# Patient Record
Sex: Female | Born: 1935
Health system: Southern US, Community
[De-identification: ages and names within clinical notes are randomized; demographics above are authoritative.]

## PROBLEM LIST (undated history)

## (undated) DIAGNOSIS — M4800 Spinal stenosis, site unspecified: Secondary | ICD-10-CM

## (undated) DIAGNOSIS — M1990 Unspecified osteoarthritis, unspecified site: Secondary | ICD-10-CM

## (undated) DIAGNOSIS — I10 Essential (primary) hypertension: Secondary | ICD-10-CM

## (undated) DIAGNOSIS — I83892 Varicose veins of left lower extremities with other complications: Secondary | ICD-10-CM

## (undated) HISTORY — PX: OTHER SURGICAL HISTORY: SHX169

## (undated) HISTORY — PX: CHOLECYSTECTOMY: SHX55

## (undated) HISTORY — DX: Essential (primary) hypertension: I10

## (undated) HISTORY — DX: Unspecified osteoarthritis, unspecified site: M19.90

## (undated) HISTORY — DX: Spinal stenosis, site unspecified: M48.00

## (undated) HISTORY — DX: Varicose veins of left lower extremity with other complications: I83.892

## (undated) HISTORY — PX: FRACTURE SURGERY: SHX138

---

## 2006-09-06 ENCOUNTER — Ambulatory Visit (HOSPITAL_COMMUNITY): Admission: RE | Admit: 2006-09-06 | Discharge: 2006-09-06 | Payer: Self-pay | Admitting: Internal Medicine

## 2006-09-06 ENCOUNTER — Ambulatory Visit: Payer: Self-pay | Admitting: Internal Medicine

## 2006-09-06 HISTORY — PX: COLONOSCOPY: SHX5424

## 2008-12-31 ENCOUNTER — Ambulatory Visit (HOSPITAL_COMMUNITY): Admission: RE | Admit: 2008-12-31 | Discharge: 2008-12-31 | Payer: Self-pay | Admitting: Family Medicine

## 2010-02-17 ENCOUNTER — Ambulatory Visit (HOSPITAL_COMMUNITY): Admission: RE | Admit: 2010-02-17 | Discharge: 2010-02-17 | Payer: Self-pay | Admitting: Family Medicine

## 2010-04-08 ENCOUNTER — Ambulatory Visit (HOSPITAL_COMMUNITY): Admission: RE | Admit: 2010-04-08 | Discharge: 2010-04-08 | Payer: Self-pay | Admitting: Ophthalmology

## 2010-04-29 ENCOUNTER — Ambulatory Visit (HOSPITAL_COMMUNITY): Admission: RE | Admit: 2010-04-29 | Discharge: 2010-04-29 | Payer: Self-pay | Admitting: Ophthalmology

## 2010-06-16 ENCOUNTER — Ambulatory Visit (HOSPITAL_COMMUNITY): Admission: RE | Admit: 2010-06-16 | Discharge: 2010-06-16 | Payer: Self-pay | Admitting: Family Medicine

## 2011-02-17 LAB — BASIC METABOLIC PANEL
Chloride: 99 mEq/L (ref 96–112)
GFR calc Af Amer: >60 mL/min (ref >60)
GFR calc non Af Amer: 54 mL/min — ABNORMAL LOW (ref >60)
Potassium: 4.2 mEq/L (ref 3.5–5.1)

## 2011-03-16 ENCOUNTER — Other Ambulatory Visit (HOSPITAL_COMMUNITY): Payer: Self-pay | Admitting: Family Medicine

## 2011-03-16 DIAGNOSIS — Z139 Encounter for screening, unspecified: Secondary | ICD-10-CM

## 2011-04-07 ENCOUNTER — Other Ambulatory Visit (HOSPITAL_COMMUNITY): Payer: Self-pay | Admitting: Family Medicine

## 2011-04-07 DIAGNOSIS — M545 Low back pain: Secondary | ICD-10-CM

## 2011-04-13 ENCOUNTER — Ambulatory Visit (HOSPITAL_COMMUNITY)
Admission: RE | Admit: 2011-04-13 | Discharge: 2011-04-13 | Disposition: A | Discharge status: Home / Self Care | Payer: Medicare HMO | Source: Ambulatory Visit | Attending: Family Medicine | Admitting: Family Medicine

## 2011-04-13 DIAGNOSIS — Z139 Encounter for screening, unspecified: Secondary | ICD-10-CM

## 2011-04-13 DIAGNOSIS — Z1231 Encounter for screening mammogram for malignant neoplasm of breast: Secondary | ICD-10-CM | POA: Insufficient documentation

## 2011-04-14 ENCOUNTER — Ambulatory Visit (HOSPITAL_COMMUNITY)
Admission: RE | Admit: 2011-04-14 | Discharge: 2011-04-14 | Disposition: A | Discharge status: Home / Self Care | Payer: Medicare HMO | Source: Ambulatory Visit | Attending: Family Medicine | Admitting: Family Medicine

## 2011-04-14 DIAGNOSIS — R937 Abnormal findings on diagnostic imaging of other parts of musculoskeletal system: Secondary | ICD-10-CM | POA: Insufficient documentation

## 2011-04-14 DIAGNOSIS — M545 Low back pain, unspecified: Secondary | ICD-10-CM | POA: Insufficient documentation

## 2011-04-14 DIAGNOSIS — M5380 Other specified dorsopathies, site unspecified: Secondary | ICD-10-CM | POA: Insufficient documentation

## 2011-04-17 NOTE — Op Note (Signed)
NAME:  Anna Mcconnell, Anna Mcconnell                ACCOUNT NO.:  [id number]   MEDICAL RECORD NO.:  [id number]          PATIENT TYPE:  AMB   LOCATION:  DAY                           FACILITY:  [facility]   PHYSICIAN:  R. Roetta Sessions, M.D. DATE OF BIRTH:  12-25-1930   DATE OF PROCEDURE:  09/06/2006  DATE OF DISCHARGE:                                 OPERATIVE REPORT   PROCEDURE:  Screening colonoscopy.   INDICATIONS FOR PROCEDURE:  The patient is a 77 year old lady with no lower  GI tract symptoms who has never had her lower GI tract imaged, referred over  by Dr. John Giovanni for colon cancer screening via colonoscopy.  There  is no family history colorectal neoplasia.  Colonoscopy is now being done as  a screening maneuver.  This approach has discussed with the patient at  length.  Potential risks, benefits and alternatives have been reviewed,  questions answered, and she is agreeable.  Please see the documentation in  the medical record.   PROCEDURE NOTE:  O2 saturation, blood pressure, pulse and respirations were  monitored throughout the entire procedure.  Conscious sedation with Versed 4  mg IV and Demerol 75 mg IV in divided doses.   INSTRUMENT:  Olympus video chip system.   FINDINGS:  Digital rectal exam revealed no abnormalities.   ENDOSCOPIC FINDINGS:  Prep was adequate.   Rectum:  Examination of the rectal mucosa including retroflexed view of the  anal verge revealed no abnormalities.   Colon:  Colonic mucosa was surveyed from the rectosigmoid junction through  the left, transverse and right colon into the area of the appendiceal  orifice, ileocecal valve and cecum.  These structures were well seen and  photographed for the record.  From this level, scope was slowly and  cautiously withdrawn.  All previously mentioned mucosal surfaces were again  seen.  The patient diffusely pigmented colonic mucosa consistent with  melanosis colon.  She had scattered left-sided diverticula.  The  the  remainder of the colonic mucosa appeared normal.  It was well seen.  Cecal  withdrawal time 8 minutes.  The patient tolerated the procedure well and was  reactive to endoscopy.   IMPRESSION:  Normal rectum.  Diffusely pigmented colon consistent with  melanosis colon.  Sigmoid diverticula.  The remainder of the colonic mucosa  appeared normal.   RECOMMENDATIONS:  1. Diverticulosis literature given to Ms. Maurine Minister.  2. Consider 1 more screening colonoscopy in 10 years.      Jonathon Bellows, M.D.  Electronically Signed     RMR/MEDQ  D:  09/06/2006  T:  09/07/2006  Job:  [id number]   cc:   Mila Homer. Sudie Bailey, M.D.  Fax: [phone]

## 2011-04-20 ENCOUNTER — Other Ambulatory Visit: Payer: Self-pay | Admitting: Family Medicine

## 2011-04-20 DIAGNOSIS — M545 Low back pain: Secondary | ICD-10-CM

## 2011-04-22 ENCOUNTER — Other Ambulatory Visit: Payer: Medicare HMO

## 2011-04-23 ENCOUNTER — Other Ambulatory Visit: Payer: Medicare HMO

## 2011-04-30 ENCOUNTER — Other Ambulatory Visit: Payer: Self-pay | Admitting: Family Medicine

## 2011-04-30 ENCOUNTER — Ambulatory Visit
Admission: RE | Admit: 2011-04-30 | Discharge: 2011-04-30 | Disposition: A | Discharge status: Home / Self Care | Payer: Medicare HMO | Source: Ambulatory Visit | Attending: Family Medicine | Admitting: Family Medicine

## 2011-04-30 DIAGNOSIS — M545 Low back pain, unspecified: Secondary | ICD-10-CM

## 2011-06-04 ENCOUNTER — Other Ambulatory Visit: Payer: Self-pay | Admitting: Family Medicine

## 2011-06-04 DIAGNOSIS — M545 Low back pain, unspecified: Secondary | ICD-10-CM

## 2011-06-17 ENCOUNTER — Ambulatory Visit
Admission: RE | Admit: 2011-06-17 | Discharge: 2011-06-17 | Disposition: A | Discharge status: Home / Self Care | Payer: Medicare HMO | Source: Ambulatory Visit | Attending: Family Medicine | Admitting: Family Medicine

## 2011-06-17 DIAGNOSIS — M545 Low back pain: Secondary | ICD-10-CM

## 2011-06-17 MED ORDER — METHYLPREDNISOLONE ACETATE 40 MG/ML INJ SUSP (RADIOLOG
120.0000 mg | Freq: Once | INTRAMUSCULAR | Status: AC
  Administered 2011-06-17: 120 mg via EPIDURAL

## 2011-06-17 MED ORDER — IOHEXOL 180 MG/ML  SOLN
1.0000 mL | Freq: Once | INTRAMUSCULAR | Status: AC | PRN
  Administered 2011-06-17: 1 mL via EPIDURAL

## 2011-07-30 ENCOUNTER — Other Ambulatory Visit: Payer: Self-pay | Admitting: Family Medicine

## 2011-07-30 DIAGNOSIS — M549 Dorsalgia, unspecified: Secondary | ICD-10-CM

## 2011-07-31 ENCOUNTER — Ambulatory Visit
Admission: RE | Admit: 2011-07-31 | Discharge: 2011-07-31 | Disposition: A | Discharge status: Home / Self Care | Payer: Medicare HMO | Source: Ambulatory Visit | Attending: Family Medicine | Admitting: Family Medicine

## 2011-07-31 DIAGNOSIS — M549 Dorsalgia, unspecified: Secondary | ICD-10-CM

## 2011-07-31 MED ORDER — METHYLPREDNISOLONE ACETATE 40 MG/ML INJ SUSP (RADIOLOG
120.0000 mg | Freq: Once | INTRAMUSCULAR | Status: AC
  Administered 2011-07-31: 120 mg via EPIDURAL

## 2011-07-31 MED ORDER — IOHEXOL 180 MG/ML  SOLN
1.0000 mL | Freq: Once | INTRAMUSCULAR | Status: AC | PRN
  Administered 2011-07-31: 1 mL via EPIDURAL

## 2011-12-01 HISTORY — PX: CATARACT EXTRACTION: SUR2

## 2012-05-03 ENCOUNTER — Other Ambulatory Visit (HOSPITAL_COMMUNITY): Payer: Self-pay | Admitting: Family Medicine

## 2012-05-03 DIAGNOSIS — M545 Low back pain: Secondary | ICD-10-CM

## 2012-05-06 ENCOUNTER — Ambulatory Visit (HOSPITAL_COMMUNITY)
Admission: RE | Admit: 2012-05-06 | Discharge: 2012-05-06 | Disposition: A | Discharge status: Home / Self Care | Payer: Medicare HMO | Source: Ambulatory Visit | Attending: Family Medicine | Admitting: Family Medicine

## 2012-05-06 DIAGNOSIS — M5137 Other intervertebral disc degeneration, lumbosacral region: Secondary | ICD-10-CM | POA: Insufficient documentation

## 2012-05-06 DIAGNOSIS — M545 Low back pain, unspecified: Secondary | ICD-10-CM | POA: Insufficient documentation

## 2012-05-06 DIAGNOSIS — M51379 Other intervertebral disc degeneration, lumbosacral region without mention of lumbar back pain or lower extremity pain: Secondary | ICD-10-CM | POA: Insufficient documentation

## 2014-04-05 ENCOUNTER — Ambulatory Visit (INDEPENDENT_AMBULATORY_CARE_PROVIDER_SITE_OTHER): Payer: Medicare Other | Admitting: Orthopedic Surgery

## 2014-04-05 ENCOUNTER — Ambulatory Visit (INDEPENDENT_AMBULATORY_CARE_PROVIDER_SITE_OTHER): Payer: Medicare Other

## 2014-04-05 VITALS — BP 108/66 | Ht 64.0 in | Wt 193.0 lb

## 2014-04-05 DIAGNOSIS — M25569 Pain in unspecified knee: Secondary | ICD-10-CM

## 2014-04-05 DIAGNOSIS — M25561 Pain in right knee: Secondary | ICD-10-CM

## 2014-04-05 DIAGNOSIS — M25562 Pain in left knee: Secondary | ICD-10-CM

## 2014-04-05 NOTE — Progress Notes (Signed)
Patient ID: Anna Mcconnell, female   DOB: 04/21/1931, 80 y.o.   MRN: [id number]  Chief Complaint  Patient presents with  . Knee Pain    Bilateral knee pain. Consult from DR. Gosrani    HISTORY: 80 year old female presents with bilateral knee pain history of chronic back pain already evaluated by Dr. Christy Sartorius he says no surgery can be done to help her he placed her in a pole. She comes in on this occasion for it out of 10 throbbing bilateral medial knee pain associated with catching and swelling. Review of systems notable for fatigue joint pain numbness unsteady gait otherwise systems are normal  She had a hysterectomy varicose vein surgery she has chronic edema she had gallbladder surgery. She has family history of lung disease and arthritis  She is retired she is widowed she does not smoke or drink  She takes atenolol hydrochlorothiazide naproxen tramadol lorazepam vitamin D and Voltaren gel.  Vital signs:   General the patient is well-developed and well-nourished grooming and hygiene are normal Oriented x3 Mood and affect normal Ambulation normal  Inspection of the bilateral knees and chin she has good range of motion but there is crepitance on range of motion in bilateral medial joint line tenderness motor exam is normal skin is clean dry and intact she has peripheral edema with normal sensation bilaterally  Imaging will be performed  Recommend bilateral injections  Return in 3 months  Continue tramadol and anti-inflammatories  Physical therapy for one month for strengthening gait control

## 2014-04-05 NOTE — Patient Instructions (Addendum)
Call [facility] to arrange thearpy 2 times a week for 4 weeks  Joint Injection  Care After  Refer to this sheet in the next few days. These instructions provide you with information on caring for yourself after you have had a joint injection. Your caregiver also may give you more specific instructions. Your treatment has been planned according to current medical practices, but problems sometimes occur. Call your caregiver if you have any problems or questions after your procedure.  After any type of joint injection, it is not uncommon to experience:  Soreness, swelling, or bruising around the injection site.  Mild numbness, tingling, or weakness around the injection site caused by the numbing medicine used before or with the injection. It also is possible to experience the following effects associated with the specific agent after injection:  Iodine-based contrast agents:  Allergic reaction (itching, hives, widespread redness, and swelling beyond the injection site).  Corticosteroids (These effects are rare.):  Allergic reaction.  Increased blood sugar levels (If you have diabetes and you notice that your blood sugar levels have increased, notify your caregiver).  Increased blood pressure levels.  Mood swings.  Hyaluronic acid in the use of viscosupplementation.  Temporary heat or redness.  Temporary rash and itching.  Increased fluid accumulation in the injected joint. These effects all should resolve within a day after your procedure.  HOME CARE INSTRUCTIONS  Limit yourself to light activity the day of your procedure. Avoid lifting heavy objects, bending, stooping, or twisting.  Take prescription or over-the-counter pain medication as directed by your caregiver.  You may apply ice to your injection site to reduce pain and swelling the day of your procedure. Ice may be applied 3-4 times:  Put ice in a plastic bag.  Place a towel between your skin and the bag.  Leave the ice on for no longer than  15-20 minutes each time. SEEK IMMEDIATE MEDICAL CARE IF:  Pain and swelling get worse rather than better or extend beyond the injection site.  Numbness does not go away.  Blood or fluid continues to leak from the injection site.  You have chest pain.  You have swelling of your face or tongue.  You have trouble breathing or you become dizzy.  You develop a fever, chills, or severe tenderness at the injection site that last longer than 1 day. MAKE SURE YOU:  Understand these instructions.  Watch your condition.  Get help right away if you are not doing well or if you get worse. Document Released: 07/30/2011 Document Revised: 02/08/2012 Document Reviewed: 07/30/2011  [facility] Patient Information 2014 [place].

## 2014-04-30 ENCOUNTER — Telehealth (HOSPITAL_COMMUNITY): Payer: Self-pay

## 2014-05-03 ENCOUNTER — Inpatient Hospital Stay (HOSPITAL_COMMUNITY): Admission: RE | Admit: 2014-05-03 | Payer: Medicare Other | Source: Ambulatory Visit | Admitting: Physical Therapy

## 2014-07-12 ENCOUNTER — Ambulatory Visit (INDEPENDENT_AMBULATORY_CARE_PROVIDER_SITE_OTHER): Payer: Medicare Other | Admitting: Orthopedic Surgery

## 2014-07-12 VITALS — BP 129/73 | Ht 64.0 in | Wt 193.0 lb

## 2014-07-12 DIAGNOSIS — M25561 Pain in right knee: Secondary | ICD-10-CM

## 2014-07-12 DIAGNOSIS — M25569 Pain in unspecified knee: Secondary | ICD-10-CM

## 2014-07-12 DIAGNOSIS — M25562 Pain in left knee: Secondary | ICD-10-CM

## 2014-07-12 NOTE — Patient Instructions (Addendum)
Go for one therapy session for home exercises  Call Dr Aline Brochure in 1 month

## 2014-07-12 NOTE — Progress Notes (Signed)
Follow up visit   BP 129/73  Ht 5\' 4"  (1.626 m)  Wt 193 lb (87.544 kg)  BMI 33.11 kg/m2   Osteoarthritis bilateral knees with right greater than left after 3 months of anti-inflammatories patient has not improved she was not able to undergo physical therapy secondary to cost  Review of systems she's also having some mild back pain nonradicular in nature  Vital signs are stable she is awake alert and oriented x3 mood and affect are normal she has valgus alignment to both knees the right knee is hurting worse than the left just lateral compartment pain with approximate 125 of knee flexion. Collateral ligaments and anterior and posterior cruciate ligaments are stable. Motor exam is normal her skin is intact with no neurovascular deficits  We decided to go ahead and injected the knee today. She will go to therapy for one visit return to the pool for additional therapy and then call in 1 month to see if her knee is any better and if not we will proceed with knee replacement surgery

## 2014-07-25 ENCOUNTER — Ambulatory Visit (HOSPITAL_COMMUNITY)
Admission: RE | Admit: 2014-07-25 | Discharge: 2014-07-25 | Disposition: A | Discharge status: Home / Self Care | Payer: Medicare Other | Source: Ambulatory Visit | Attending: Orthopedic Surgery | Admitting: Orthopedic Surgery

## 2014-07-25 DIAGNOSIS — M25669 Stiffness of unspecified knee, not elsewhere classified: Secondary | ICD-10-CM | POA: Diagnosis present

## 2014-07-25 DIAGNOSIS — IMO0001 Reserved for inherently not codable concepts without codable children: Secondary | ICD-10-CM | POA: Insufficient documentation

## 2014-07-25 DIAGNOSIS — M25659 Stiffness of unspecified hip, not elsewhere classified: Secondary | ICD-10-CM

## 2014-07-25 DIAGNOSIS — M6281 Muscle weakness (generalized): Secondary | ICD-10-CM

## 2014-07-25 NOTE — Evaluation (Signed)
Physical Therapy Evaluation  Patient Details  Name: Anna Mcconnell MRN: [id number] Date of Birth: 07-14-1931  Today's Date: 07/25/2014 Time: 1350-1430 PT Time Calculation (min): 40 min     Charges: 1 evaluation, TherEx 1405-1430         Visit#: 1 of 10  Re-eval: 08/24/14 Assessment Diagnosis: Bilateral knee pain Next MD Visit: Aline Brochure Prior Therapy: no  Authorization: [employer] - medicare     Past Medical History: No past medical history on file. Past Surgical History: No past surgical history on file.  Subjective Symptoms/Limitations Symptoms: Knee pain. Patient's goals is to come in for this one treatment and to not come back, she would like exercises. Patient would like to come once a month to progress exercises as needed.  Pertinent History: Patent has bilateral knee pain that alternates from one knee to the next. no history of orthoeadic surgeries How long can you walk comfortably?: <68minutes Patient Stated Goals: patient wants to be able to walk and go up and down stairs with less pain.  Pain Assessment Currently in Pain?: Yes Pain Location: Knee Pain Orientation: Right;Left;Anterior Pain Type: Chronic pain Effect of Pain on Daily Activities: una ble to do steps with a reciprocal gait pattern and needs UE support,   Cognition/Observation Observation/Other Assessments Observations: Gait: limited hip internal rotation, excessive toe out, limited pelvic rotation,  excessive knee valgus moment, scissoring of knees and foot flat, with excessive pronation and limited calcaneal invrsion, excessive eversion.   Sensation/Coordination/Flexibility/Functional Tests Flexibility Obers: Negative 90/90: Negative Functional Tests Functional Tests: ely test positive/  Assessment RLE AROM (degrees) RLE Overall AROM Comments: Rt 54 degrees ER, Lt 21 IR Right Knee Extension: 0 Right Knee Flexion: 125 Right Ankle Dorsiflexion: 10 RLE Strength Right Hip Extension: 2+/5 Right Hip  ABduction: 2+/5 LLE AROM (degrees) Left Hip External Rotation : 46 Left Hip Internal Rotation : 24 Left Knee Extension: 0 Left Knee Flexion: 125 Left Ankle Dorsiflexion: 5 LLE Strength Left Hip ABduction: 2+/5 Left Knee Extension: 2+/5  Exercise/Treatments Stretches Quadraceps 4x 20seconds Piriformis 10x 3second hold Hamstring 10x 3seconds hold quadraceps 10x 3 second hold Groin 10x 3 second hold Calf 10x 3 second hold 3D hip excursion 10x Bridges 190x second hold  Physical Therapy Assessment and Plan PT Assessment and Plan Clinical Impression Statement: Patient displays bilateral knee pain attributed to limited hip mobility and limited ankle mobility as well as decreased bilateral glute strenth with Lt hip weaker than Rt hipp resulting in a trendelenburg gait pattern, excessive knee valgus moment, and scissoring on knees during gait. patient will benefit from skilled phsyical therapy to address the above listed limitations to returnt op walking pain free and ambulatign up and down stairs withotu difficulty.  Pt will benefit from skilled therapeutic intervention in order to improve on the following deficits: Abnormal gait;Decreased activity tolerance;Decreased balance;Difficulty walking;Pain;Impaired flexibility;Improper body mechanics;Decreased range of motion;Increased fascial restricitons;Decreased mobility;Decreased strength Rehab Potential: Good Clinical Impairments Affecting Rehab Potential: highly motivated, limited ability to attend therapy due to financial restraints.  PT Frequency: Min 1X/week PT Duration: 12 weeks PT Plan: initial focus on stretches to improve gait. Next session to focus on introduciton of strengtheing exercises to improve gait mechanics.     Goals Home Exercise Program Pt/caregiver will Perform Home Exercise Program: For increased ROM PT Goal: Perform Home Exercise Program - Progress: Goal set today PT Short Term Goals Time to Complete Short Term  Goals: 4 weeks PT Short Term Goal 1: Patient will increase hip internal rotation >35 degrees  bilaterally to improve shock absorbtion during gait PT Short Term Goal 2: Patient will increase hip extension to >10 degrees bilaterally to improve stride length PT Short Term Goal 3: Patient will increase ankle dorsiflexion to > 15 degrees bilaterally to decrease early heel rise.  PT Short Term Goal 4: Patient will demonstrate a negative ely's test indicateing improving quadraceps mobilty  and improved ability to squat to chair PT Long Term Goals Time to Complete Long Term Goals: 12 weeks PT Long Term Goal 1: Patient will demosntrate increased hip abduction strength to 3+/5MMT to increase hip and knee stability during gait PT Long Term Goal 2: Patient will demosntrate increased hip abduction strength to 4/5MMT to increase hip and knee stability during gait so patient will ambulate withotu trendelenberg gait Long Term Goal 3: Patient will dmeosntrate hip extension strength of 4/5 MMT to improve ability to squat to chair Long Term Goal 4: Patient will dmeosntrate increased quad/hamstring strength to 5/5 MMT so patient caqn ambulate up and down stairs.   Problem List Patient Active Problem List   Diagnosis Date Noted  . Stiffness of joint, not elsewhere classified, lower leg 07/25/2014  . Stiffness of joint, not elsewhere classified, pelvic region and thigh 07/25/2014  . Muscle weakness (generalized) 07/25/2014    PT - End of Session Activity Tolerance: Patient tolerated treatment well General Behavior During Therapy: WFL for tasks assessed/performed PT Plan of Care PT Home Exercise Plan: quad, hamstring, calf, groin, hip flexor stretches and bridges PT Patient Instructions: hand outs given.  Consulted and Agree with Plan of Care: Patient  GP Functional Assessment Tool Used: FOTO to be completed next session, Clinical judgement based on strength and ROM measurements Functional Limitation:  Mobility: Walking and moving around Mobility: Walking and Moving Around Current Status (B0175): At least 40 percent but less than 60 percent impaired, limited or restricted Mobility: Walking and Moving Around Goal Status [phone]): At least 20 percent but less than 40 percent impaired, limited or restricted  Delanda Bulluck R 07/25/2014, 3:00 PM  Physician Documentation Your signature is required to indicate approval of the treatment plan as stated above.  Please sign and either send electronically or make a copy of this report for your files and return this physician signed original.   Please mark one 1.__approve of plan  2. ___approve of plan with the following conditions.   ______________________________                                                          _____________________ Physician Signature                                                                                                             Date

## 2014-08-10 ENCOUNTER — Inpatient Hospital Stay (HOSPITAL_COMMUNITY): Admission: RE | Admit: 2014-08-10 | Payer: Medicare Other | Source: Ambulatory Visit | Admitting: Physical Therapy

## 2014-08-15 ENCOUNTER — Encounter: Payer: Self-pay | Admitting: Internal Medicine

## 2014-08-16 ENCOUNTER — Ambulatory Visit (HOSPITAL_COMMUNITY): Payer: Medicare Other | Admitting: Physical Therapy

## 2014-08-20 ENCOUNTER — Encounter: Payer: Self-pay | Admitting: Gastroenterology

## 2014-08-20 ENCOUNTER — Ambulatory Visit (INDEPENDENT_AMBULATORY_CARE_PROVIDER_SITE_OTHER): Payer: Medicare Other | Admitting: Gastroenterology

## 2014-08-20 VITALS — BP 133/74 | HR 57 | Temp 97.0°F | Ht 63.0 in | Wt 190.6 lb

## 2014-08-20 DIAGNOSIS — K5900 Constipation, unspecified: Secondary | ICD-10-CM | POA: Insufficient documentation

## 2014-08-20 DIAGNOSIS — K5641 Fecal impaction: Secondary | ICD-10-CM | POA: Insufficient documentation

## 2014-08-20 MED ORDER — LINACLOTIDE 290 MCG PO CAPS: 290.0000 ug | ORAL_CAPSULE | Freq: Every day | ORAL | Status: DC

## 2014-08-20 NOTE — Progress Notes (Signed)
Primary Care Physician:  Robert Bellow, MD  Primary Gastroenterologist:  Garfield Cornea, MD   Chief Complaint  Patient presents with  . Constipation    HPI:  Anna Mcconnell is a 80 y.o. female here for further evaluation of constipation. She was last seen at time of colonoscopy in October 2007. This was her one and only colonoscopy. She had melanosis:, Sigmoid diverticula but otherwise unremarkable. Complains of one-year history of worsening constipation. Chronically has had a tendency towards constipation. Has been using various over the counter laxatives recently without significant improvement. Tried fleets enema, Dulcolax suppositories, stool softeners post laxatives. MiraLax in the past. Has been having issues with her hemorrhoids swelling. No bleeding. Recently had to digitally disimpact herself. Felt hard small stool in her rectum, feels like she's never been able to pass it. Worried about having a colonoscopy because she feels like she has nobody to take her. No family here locally. Trying [facility] for hemorrhoid swelling.   Current Outpatient Prescriptions  Medication Sig Dispense Refill  . atenolol (TENORMIN) 50 MG tablet 50 mg daily.       . hydrochlorothiazide (HYDRODIURIL) 25 MG tablet 25 mg daily.       Marland Kitchen LORazepam (ATIVAN) 1 MG tablet 1 mg. Only as needed     Not even every daily      . naproxen (NAPROSYN) 500 MG tablet 500 mg 2 (two) times daily with a meal. Takes once daily      . traMADol (ULTRAM) 50 MG tablet 50 mg daily.       . Vitamin D, Ergocalciferol, (DRISDOL) 50000 UNITS CAPS capsule Take 50,000 Units by mouth every 7 (seven) days.      . Linaclotide (LINZESS) 290 MCG CAPS capsule Take 1 capsule (290 mcg total) by mouth daily. On empty stomach  30 capsule  5   No current facility-administered medications for this visit.    Allergies as of 08/20/2014  . (No Known Allergies)    Past Medical History  Diagnosis Date  . HTN (hypertension)   . Spinal stenosis   .  Arthritis     Past Surgical History  Procedure Laterality Date  . Colonoscopy  09/06/2006    FTD:DUKGUR rectum.  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  Sigmoid diverticula.  The remainder of the colonic mucosa normal  . Hysterectomy    . Varicose vein ligation    . Cholecystectomy      Family History  Problem Relation Age of Onset  . Colon cancer Neg Hx   . Tuberculosis Mother     deceased at young age  . Tuberculosis Father     deceased at young age    History   Social History  . Marital Status: Widowed    Spouse Name: N/A    Number of Children: N/A  . Years of Education: N/A   Occupational History  . Not on file.   Social History Main Topics  . Smoking status: Former Research scientist (life sciences)  . Smokeless tobacco: Not on file     Comment: Quit smoking x 25 years  . Alcohol Use: Not on file  . Drug Use: Not on file  . Sexual Activity: Not on file   Other Topics Concern  . Not on file   Social History Narrative  . No narrative on file      ROS:  General: Negative for anorexia, weight loss, fever, chills, fatigue, weakness. Eyes: Negative for vision changes.  ENT: Negative for hoarseness, difficulty swallowing ,  nasal congestion. CV: Negative for chest pain, angina, palpitations, dyspnea on exertion, peripheral edema.  Respiratory: Negative for dyspnea at rest, dyspnea on exertion, cough, sputum, wheezing.  GI: See history of present illness. GU:  Negative for dysuria, hematuria, urinary incontinence, urinary frequency, nocturnal urination.  MS: Chronic knee and back pain  Derm: Negative for rash or itching.  Neuro: Negative for weakness, abnormal sensation, seizure, frequent headaches, memory loss, confusion.  Psych: Negative for anxiety, depression, suicidal ideation, hallucinations.  Endo: Negative for unusual weight change.  Heme: Negative for bruising or bleeding. Allergy: Negative for rash or hives.    Physical Examination:  BP 133/74  Pulse  57  Temp(Src) 97 F (36.1 C) (Oral)  Ht 5\' 3"  (1.6 m)  Wt 190 lb 9.6 oz (86.456 kg)  BMI 33.77 kg/m2   General: Well-nourished, well-developed in no acute distress.  Head: Normocephalic, atraumatic.   Eyes: Conjunctiva pink, no icterus. Mouth: Oropharyngeal mucosa moist and pink , no lesions erythema or exudate. Neck: Supple without thyromegaly, masses, or lymphadenopathy.  Lungs: Clear to auscultation bilaterally.  Heart: Regular rate and rhythm, no murmurs rubs or gallops.  Abdomen: Bowel sounds are normal, nontender, nondistended, no hepatosplenomegaly or masses, no abdominal bruits or    hernia , no rebound or guarding.   Rectal: Large hemorrhoid noted externally without swelling, erythema. Good anorectal sphincter tone. Stool ball noted proximally, patient was able to bring down with bearing down but did not tolerate attempts for disimpaction. Felt this was less likely to be a rectal mass. No blood noted on the exam glove. Brown stool heme negative. Extremities: No lower extremity edema. No clubbing or deformities.  Neuro: Alert and oriented x 4 , grossly normal neurologically.  Skin: Warm and dry, no rash or jaundice.   Psych: Alert and cooperative, normal mood and affect.  Labs: Labs from April 2015 total bilirubin 0.7, alkaline phosphatase 40, AST 15, ALT 11, albumin 4.2, creatinine 0.99, BUN 20  Imaging Studies: No results found.

## 2014-08-20 NOTE — Assessment & Plan Note (Signed)
80 year old lady with chronic constipation, small fecal impaction noted on exam. Suspect constipation worsened by tramadol use. Last colonoscopy 8 years ago. Heme negative today on exam. Patient not opposed colonoscopy but worried about transportation concerns. No family here locally. We will implement chronic constipation therapy including Linzess 232mcg daily. She will continue 2 yogurts daily for probiotic effects. Today we will give her Miralax purge. Fleet enema distally. She will come back in 2 weeks for repeat rectal exam to verify for rectal mass and stool passed. If she feels like she is not getting relief in the next day or two, she will let me know.

## 2014-08-20 NOTE — Patient Instructions (Signed)
1. Today, go home and take Linzess one on empty stomach. (You will continue this once daily for chronic constipation. A voucher for 30 day supply has been given, take it to your pharmacy.)  2. Then start Miralax for bowel cleansing. Take one packet mixed in 4 ounces of water every hour for 5 to 6 hours. After each dose of Miralax, drink 8 ounces of fluid of choice.  3. Take one Fleet enema, after your 3rd dose of Miralax.  4. Call if you have any problems.  5. Return to office in 2 weeks for repeat rectal exam. We will discuss if you need a colonoscopy at that time.

## 2014-08-21 NOTE — Progress Notes (Signed)
cc'ed to pcp °

## 2014-09-05 ENCOUNTER — Ambulatory Visit: Payer: Medicare Other | Admitting: Gastroenterology

## 2014-09-06 ENCOUNTER — Encounter: Payer: Self-pay | Admitting: Gastroenterology

## 2014-09-06 ENCOUNTER — Ambulatory Visit (INDEPENDENT_AMBULATORY_CARE_PROVIDER_SITE_OTHER): Payer: Medicare Other | Admitting: Gastroenterology

## 2014-09-06 VITALS — BP 111/66 | HR 60 | Temp 98.2°F | Ht 64.0 in | Wt 193.0 lb

## 2014-09-06 DIAGNOSIS — K6289 Other specified diseases of anus and rectum: Secondary | ICD-10-CM

## 2014-09-06 DIAGNOSIS — K5909 Other constipation: Secondary | ICD-10-CM

## 2014-09-06 DIAGNOSIS — R198 Other specified symptoms and signs involving the digestive system and abdomen: Secondary | ICD-10-CM

## 2014-09-06 NOTE — Progress Notes (Signed)
Primary Care Physician: Robert Bellow, MD  Primary Gastroenterologist:  Garfield Cornea, MD   Chief Complaint  Patient presents with  . Follow-up    HPI: Anna Mcconnell is a 80 y.o. female here for followup of constipation. She was last seen on September 21. On rectal exam she had large hemorrhoid noted externally. On digital exam she was noted to have what felt to be a stool ball but was not within reach to determine, seems to be mobile but rectal mass could not be excluded. She was heme-negative. Started on Linzess after a Miralax purge/Fleet enema. She presents back today for repeat rectal exam. She is due for routine colonoscopy 2017, did not want to pursue right now due to transportation concerns unless necessary.  Linzess every day. Has a BM about every day but takes few attempts to get it out. Feels pressure in rectum. Some gas. Feels like never gets rectum empty. No abdominal pain, vomiting, anorexia, heartburn.  Current Outpatient Prescriptions  Medication Sig Dispense Refill  . atenolol (TENORMIN) 50 MG tablet 50 mg daily.       . hydrochlorothiazide (HYDRODIURIL) 25 MG tablet 25 mg daily.       . Linaclotide (LINZESS) 290 MCG CAPS capsule Take 1 capsule (290 mcg total) by mouth daily. On empty stomach  30 capsule  5  . LORazepam (ATIVAN) 1 MG tablet 1 mg. Only as needed     Not even every daily      . naproxen (NAPROSYN) 500 MG tablet 500 mg 2 (two) times daily with a meal. Takes once daily      . Probiotic Product (PROBIOTIC DAILY PO) Take by mouth.      . traMADol (ULTRAM) 50 MG tablet 50 mg daily.       . Vitamin D, Ergocalciferol, (DRISDOL) 50000 UNITS CAPS capsule Take 50,000 Units by mouth every 7 (seven) days.       No current facility-administered medications for this visit.    Allergies as of 09/06/2014  . (No Known Allergies)   Past Medical History  Diagnosis Date  . HTN (hypertension)   . Spinal stenosis   . Arthritis    Past Surgical History    Procedure Laterality Date  . Colonoscopy  09/06/2006    [id number] rectum.  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  Sigmoid diverticula.  The remainder of the colonic mucosa normal  . Hysterectomy    . Varicose vein ligation    . Cholecystectomy     Family History  Problem Relation Age of Onset  . Colon cancer Neg Hx   . Tuberculosis Mother     deceased at young age  . Tuberculosis Father     deceased at young age   History   Social History  . Marital Status: Widowed    Spouse Name: N/A    Number of Children: N/A  . Years of Education: N/A   Social History Main Topics  . Smoking status: Former Research scientist (life sciences)  . Smokeless tobacco: None     Comment: Quit smoking x 25 years  . Alcohol Use: None  . Drug Use: None  . Sexual Activity: None   Other Topics Concern  . None   Social History Narrative  . None    ROS:  General: Negative for anorexia, weight loss, fever, chills, fatigue, weakness. ENT: Negative for hoarseness, difficulty swallowing , nasal congestion. CV: Negative for chest pain, angina, palpitations, dyspnea on exertion, peripheral edema.  Respiratory:  Negative for dyspnea at rest, dyspnea on exertion, cough, sputum, wheezing.  GI: See history of present illness. GU:  Negative for dysuria, hematuria, urinary incontinence, urinary frequency, nocturnal urination.  Endo: Negative for unusual weight change.    Physical Examination:   BP 111/66  Pulse 60  Temp(Src) 98.2 F (36.8 C) (Oral)  Ht 5\' 4"  (1.626 m)  Wt 193 lb (87.544 kg)  BMI 33.11 kg/m2  General: Well-nourished, well-developed in no acute distress.  Eyes: No icterus. Mouth: Oropharyngeal mucosa moist and pink , no lesions erythema or exudate. Lungs: Clear to auscultation bilaterally.  Heart: Regular rate and rhythm, no murmurs rubs or gallops.  Abdomen: Bowel sounds are normal, nontender, nondistended, no hepatosplenomegaly or masses, no abdominal bruits or hernia , no rebound or  guarding.   RECTAL: large hemorrhoids externally, nonthrombosed, no erythema. Proximally within rectal canal, palpable mass still persists, stool noted on glove. Could not reach around the mass to determine if stool. Somewhat tender on exam. Extremities: No lower extremity edema. No clubbing or deformities. Neuro: Alert and oriented x 4   Skin: Warm and dry, no jaundice.   Psych: Alert and cooperative, normal mood and affect.

## 2014-09-06 NOTE — Assessment & Plan Note (Signed)
80 y/o lady with chronic constipation, increased difficulty with management. Improved on Linzess and Miralax prn. Continue to have palpable mass on exam, more suspicious today. Discussed with patient. Recommend not postponing colonoscopy until 10/2014 as she had planned. Recommend pursuing diagnostic colonoscopy at this time. She will discuss with friends to see who may assist with transportation and get back with [place] to schedule.  I have discussed the risks, alternatives, benefits with regards to but not limited to the risk of reaction to medication, bleeding, infection, perforation and the patient is agreeable to proceed. Written consent to be obtained.

## 2014-09-06 NOTE — Progress Notes (Signed)
cc'ed to pcp °

## 2014-09-06 NOTE — Patient Instructions (Signed)
1. Please call me when you are able to schedule the colonoscopy.  2. Continue Linzess daily. 3. Continue Miralax at bedtime as needed.

## 2014-09-07 ENCOUNTER — Telehealth: Payer: Self-pay

## 2014-09-07 ENCOUNTER — Ambulatory Visit: Payer: Medicare Other | Admitting: Gastroenterology

## 2014-09-07 NOTE — Telephone Encounter (Signed)
I have an opening on Tuesday at 8am.

## 2014-09-07 NOTE — Telephone Encounter (Signed)
Pt is calling this morning because she pass a very hard ball of stool last night. She would like for you do another rectal exam before setting up the TCS. She thinks you could have been felling the stool and not a mass. Please advise.

## 2014-09-07 NOTE — Telephone Encounter (Signed)
Pt is aware of appointment 

## 2014-09-11 ENCOUNTER — Encounter (HOSPITAL_COMMUNITY): Payer: Self-pay | Admitting: Pharmacy Technician

## 2014-09-11 ENCOUNTER — Encounter: Payer: Self-pay | Admitting: Gastroenterology

## 2014-09-11 ENCOUNTER — Ambulatory Visit (INDEPENDENT_AMBULATORY_CARE_PROVIDER_SITE_OTHER): Payer: Medicare Other | Admitting: Gastroenterology

## 2014-09-11 ENCOUNTER — Other Ambulatory Visit: Payer: Self-pay

## 2014-09-11 VITALS — BP 118/76 | HR 68 | Temp 98.6°F | Ht 64.0 in | Wt 192.8 lb

## 2014-09-11 DIAGNOSIS — K5900 Constipation, unspecified: Secondary | ICD-10-CM

## 2014-09-11 DIAGNOSIS — K625 Hemorrhage of anus and rectum: Secondary | ICD-10-CM

## 2014-09-11 DIAGNOSIS — K6289 Other specified diseases of anus and rectum: Secondary | ICD-10-CM

## 2014-09-11 DIAGNOSIS — K5909 Other constipation: Secondary | ICD-10-CM

## 2014-09-11 DIAGNOSIS — R198 Other specified symptoms and signs involving the digestive system and abdomen: Secondary | ICD-10-CM

## 2014-09-11 MED ORDER — PEG 3350-KCL-NA BICARB-NACL 420 G PO SOLR: 4000.0000 mL | ORAL | Status: DC

## 2014-09-11 NOTE — Progress Notes (Signed)
Primary Care Physician:  Robert Bellow, MD  Primary Gastroenterologist:  Garfield Cornea, MD   Chief Complaint  Patient presents with  . Abdominal Pain  . Blood In Stools    HPI:  Anna Mcconnell is a 80 y.o. female here for repeat rectal exam on patient's request. H/O rectal mass, initially thought could represent impacted stool on 08/20/14. Repeat rectal exam 09/06/14 after management of constipation showed persistent mass. Patient has been trying to postpone colonoscopy until 10/2014 until her daughter comes in from out of state. I recommended proceeding with colonoscopy now after the second abnormal rectal exam. Patient called back in the day after her last visit and reported passes large ball of stool and requested her 3rd office visit for repeat rectal exam.   Patient presents today stating that she has found someone who can drive her to and from a colonoscopy. She still wanted to come in today to get her opinion with regards to whether she should have the procedure. Reports that for the last office visit, she had some abdominal cramping. When she home she passed a large stool ball. She states it was malodorous. Good BM yesterday. Initially hard stool and then multiple watery stools. No blood. Taking linzess every day and miralax at bedtime when needed. Didn't take over the weekend, didn't want to miss church. No abdominal pain, just pressure from constipation. No n/v.   Current Outpatient Prescriptions  Medication Sig Dispense Refill  . atenolol (TENORMIN) 50 MG tablet 50 mg daily.       . hydrochlorothiazide (HYDRODIURIL) 25 MG tablet 25 mg daily.       . Linaclotide (LINZESS) 290 MCG CAPS capsule Take 1 capsule (290 mcg total) by mouth daily. On empty stomach  30 capsule  5  . LORazepam (ATIVAN) 1 MG tablet 1 mg. Only as needed     Not even every daily      . naproxen (NAPROSYN) 500 MG tablet 500 mg 2 (two) times daily with a meal. Takes once daily      . polyethylene glycol (MIRALAX /  GLYCOLAX) packet Take 17 g by mouth daily.      . Probiotic Product (PROBIOTIC DAILY PO) Take by mouth.      . traMADol (ULTRAM) 50 MG tablet 50 mg daily.       . Vitamin D, Ergocalciferol, (DRISDOL) 50000 UNITS CAPS capsule Take 50,000 Units by mouth every 7 (seven) days.       No current facility-administered medications for this visit.    Allergies as of 09/11/2014 - Review Complete 09/11/2014  Allergen Reaction Noted  . Psyllium  09/11/2014    Past Medical History  Diagnosis Date  . HTN (hypertension)   . Spinal stenosis   . Arthritis     Past Surgical History  Procedure Laterality Date  . Colonoscopy  09/06/2006    [id number] rectum.  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  Sigmoid diverticula.  The remainder of the colonic mucosa normal  . Hysterectomy    . Varicose vein ligation    . Cholecystectomy      Family History  Problem Relation Age of Onset  . Colon cancer Neg Hx   . Tuberculosis Mother     deceased at young age  . Tuberculosis Father     deceased at young age    History   Social History  . Marital Status: Widowed    Spouse Name: N/A    Number of Children: N/A  .  Years of Education: N/A   Occupational History  . Not on file.   Social History Main Topics  . Smoking status: Former Research scientist (life sciences)  . Smokeless tobacco: Not on file     Comment: Quit smoking x 25 years  . Alcohol Use: Not on file  . Drug Use: Not on file  . Sexual Activity: Not on file   Other Topics Concern  . Not on file   Social History Narrative  . No narrative on file      ROS:  General: Negative for anorexia, weight loss, fever, chills, fatigue, weakness. Eyes: Negative for vision changes.  ENT: Negative for hoarseness, difficulty swallowing , nasal congestion. CV: Negative for chest pain, angina, palpitations, dyspnea on exertion, peripheral edema.  Respiratory: Negative for dyspnea at rest, dyspnea on exertion, cough, sputum, wheezing.  GI: See  history of present illness. GU:  Negative for dysuria, hematuria, urinary incontinence, urinary frequency, nocturnal urination.  MS: Negative for joint pain, low back pain.  Derm: Negative for rash or itching.  Neuro: Negative for weakness, abnormal sensation, seizure, frequent headaches, memory loss, confusion.  Psych: Negative for anxiety, depression, suicidal ideation, hallucinations.  Endo: Negative for unusual weight change.  Heme: Negative for bruising or bleeding. Allergy: Negative for rash or hives.    Physical Examination:  BP 118/76  Pulse 68  Temp(Src) 98.6 F (37 C) (Oral)  Ht 5\' 4"  (1.626 m)  Wt 192 lb 12.8 oz (87.454 kg)  BMI 33.08 kg/m2   General: Well-nourished, well-developed in no acute distress.  Head: Normocephalic, atraumatic.   Eyes: Conjunctiva pink, no icterus. Mouth: Oropharyngeal mucosa moist and pink , no lesions erythema or exudate. Neck: Supple without thyromegaly, masses, or lymphadenopathy.  Lungs: Clear to auscultation bilaterally.  Heart: Regular rate and rhythm, no murmurs rubs or gallops.  Abdomen: Bowel sounds are normal, nontender, nondistended, no hepatosplenomegaly or masses, no abdominal bruits or    hernia , no rebound or guarding.   Rectal: Patient declined today. Extremities: No lower extremity edema. No clubbing or deformities.  Neuro: Alert and oriented x 4 , grossly normal neurologically.  Skin: Warm and dry, no rash or jaundice.   Psych: Alert and cooperative, normal mood and affect.

## 2014-09-11 NOTE — Patient Instructions (Signed)
1. Colonoscopy as scheduled. See separate instructions. 2. Continue linzess and miralax as before.

## 2014-09-11 NOTE — Assessment & Plan Note (Signed)
Patient reports chronic constipation with increasing difficulty to manage. As previously outlined, she had a palpable mass on rectal examination twice, 2 weeks apart. Therefore suggest colonoscopy to rule out colon cancer. Patient presents today to schedule colonoscopy. She's been able to find transportation. Initially she thought that she wanted a repeat rectal exam to see if mass persisted but now she wants peace of mind and is requesting to pursue colonoscopy. Her constipation she'll continue current regimen. Colonoscopy in the near future.  I have discussed the risks, alternatives, benefits with regards to but not limited to the risk of reaction to medication, bleeding, infection, perforation and the patient is agreeable to proceed. Written consent to be obtained.

## 2014-09-13 NOTE — Progress Notes (Signed)
cc'ed to pcp °

## 2014-09-25 ENCOUNTER — Encounter (HOSPITAL_COMMUNITY): Payer: Self-pay | Admitting: *Deleted

## 2014-09-25 ENCOUNTER — Ambulatory Visit (HOSPITAL_COMMUNITY)
Admission: RE | Admit: 2014-09-25 | Discharge: 2014-09-25 | Disposition: A | Discharge status: Home / Self Care | Payer: Medicare Other | Source: Ambulatory Visit | Attending: Internal Medicine | Admitting: Internal Medicine

## 2014-09-25 ENCOUNTER — Encounter: Payer: Self-pay | Admitting: Internal Medicine

## 2014-09-25 ENCOUNTER — Encounter (HOSPITAL_COMMUNITY)
Admission: RE | Disposition: A | Discharge status: Home / Self Care | Payer: Self-pay | Source: Ambulatory Visit | Attending: Internal Medicine

## 2014-09-25 DIAGNOSIS — Z791 Long term (current) use of non-steroidal anti-inflammatories (NSAID): Secondary | ICD-10-CM | POA: Insufficient documentation

## 2014-09-25 DIAGNOSIS — K6289 Other specified diseases of anus and rectum: Secondary | ICD-10-CM | POA: Insufficient documentation

## 2014-09-25 DIAGNOSIS — Z79899 Other long term (current) drug therapy: Secondary | ICD-10-CM | POA: Insufficient documentation

## 2014-09-25 DIAGNOSIS — K5900 Constipation, unspecified: Secondary | ICD-10-CM

## 2014-09-25 DIAGNOSIS — Z87891 Personal history of nicotine dependence: Secondary | ICD-10-CM | POA: Insufficient documentation

## 2014-09-25 DIAGNOSIS — K625 Hemorrhage of anus and rectum: Secondary | ICD-10-CM

## 2014-09-25 DIAGNOSIS — R198 Other specified symptoms and signs involving the digestive system and abdomen: Secondary | ICD-10-CM

## 2014-09-25 DIAGNOSIS — I10 Essential (primary) hypertension: Secondary | ICD-10-CM | POA: Insufficient documentation

## 2014-09-25 DIAGNOSIS — Z538 Procedure and treatment not carried out for other reasons: Secondary | ICD-10-CM | POA: Insufficient documentation

## 2014-09-25 DIAGNOSIS — K648 Other hemorrhoids: Secondary | ICD-10-CM | POA: Diagnosis not present

## 2014-09-25 HISTORY — PX: COLONOSCOPY: SHX5424

## 2014-09-25 SURGERY — COLONOSCOPY
Anesthesia: Moderate Sedation

## 2014-09-25 MED ORDER — MIDAZOLAM HCL 5 MG/5ML IJ SOLN
INTRAMUSCULAR | Status: DC | PRN
Start: 2014-09-25 — End: 2014-09-25
  Administered 2014-09-25: 1 mg via INTRAVENOUS
  Administered 2014-09-25: 2 mg via INTRAVENOUS
  Administered 2014-09-25: 1 mg via INTRAVENOUS

## 2014-09-25 MED ORDER — SODIUM CHLORIDE 0.9 % IV SOLN
INTRAVENOUS | Status: DC
  Administered 2014-09-25: 12:00:00 via INTRAVENOUS

## 2014-09-25 MED ORDER — ONDANSETRON HCL 4 MG/2ML IJ SOLN
INTRAMUSCULAR | Status: DC | PRN
  Administered 2014-09-25: 4 mg via INTRAVENOUS

## 2014-09-25 MED ORDER — MIDAZOLAM HCL 5 MG/5ML IJ SOLN
INTRAMUSCULAR | Status: AC
  Filled 2014-09-25: qty 10

## 2014-09-25 MED ORDER — MEPERIDINE HCL 100 MG/ML IJ SOLN
INTRAMUSCULAR | Status: AC
  Filled 2014-09-25: qty 2

## 2014-09-25 MED ORDER — ONDANSETRON HCL 4 MG/2ML IJ SOLN
INTRAMUSCULAR | Status: AC
  Filled 2014-09-25: qty 2

## 2014-09-25 MED ORDER — MEPERIDINE HCL 100 MG/ML IJ SOLN
INTRAMUSCULAR | Status: DC | PRN
  Administered 2014-09-25: 50 mg via INTRAVENOUS
  Administered 2014-09-25: 25 mg via INTRAVENOUS

## 2014-09-25 MED ORDER — STERILE WATER FOR IRRIGATION IR SOLN
Status: DC | PRN
  Administered 2014-09-25: 14:00:00

## 2014-09-25 NOTE — Op Note (Signed)
[facility] [address] [place], [address]   COLONOSCOPY PROCEDURE REPORT  PATIENT: Anna Mcconnell, Anna Mcconnell  MR#: [id number] BIRTHDATE: February 27, 1931 , 51  yrs. old GENDER: female ENDOSCOPIST: R.  Garfield Cornea, MD FACP [facility] REFERRED XB:JYNWG Karie Kirks, M.D. PROCEDURE DATE:  October 25, 2014 PROCEDURE:   Colonoscopy, diagnostic  (incomplete) INDICATIONS:rectal mass. MEDICATIONS: Versed 4 mg IV 75 mg IV in divided doses.  Zofran 4 mg IV. ASA CLASS:       Class II  CONSENT: The risks, benefits, alternatives and imponderables including but not limited to bleeding, perforation as well as the possibility of a missed lesion have been reviewed.  The potential for biopsy, lesion removal, etc. have also been discussed. Questions have been answered.  All parties agreeable.  Please see the history and physical in the medical record for more information.  DESCRIPTION OF PROCEDURE:   After the risks benefits and alternatives of the procedure were thoroughly explained, informed consent was obtained.  The digital rectal exam revealed no abnormalities of the rectum. I did not appreciate a mass on DRE The [id number] ([id number])  endoscope was introduced through the anus and advanced to the mid sigmoid colon. No adverse events experienced.   The quality of the prep was inadequate.  The instrument was then slowly withdrawn as the colon was fully examined.      COLON FINDINGS: A thorough examination of the rectal mucosa including retroflexed view the anal verge demonstrated no rectal mucosal abdomen is aside from internal hemorrhoids.the scope was advanced in the sigmoid appeared in the mid sigmoid, there was a bolus of solid stool which occluded the lumen.  I could not negotiate around it.  With this finding, I terminated the examination.  Retroflexion was performed. .  Withdrawal time=not applicable     .  The scope was withdrawn and the procedure completed. COMPLICATIONS: There were no  immediate complications.  ENDOSCOPIC IMPRESSION: No rectal mass on digital rectal exam or with colonoscopic visualization. Internal hemorrhoids. Poor prep precluded complete examination.  RECOMMENDATIONS: Continue Linzess 2990 daily; increase MiraLAX  217 g orally twice daily. Office visit with [place] in 4 weeks.  eSigned:  R. Garfield Cornea, MD Rosalita Chessman [facility] 25-Oct-2014 2:55 PM   cc:  CPT CODES: ICD CODES:  The ICD and CPT codes recommended by this software are interpretations from the data that the clinical staff has captured with the software.  The verification of the translation of this report to the ICD and CPT codes and modifiers is the sole responsibility of the health care institution and practicing physician where this report was generated.  [employer] will not be held responsible for the validity of the ICD and CPT codes included on this report.  [employer] assumes no liability for data contained or not contained herein. CPT is a Designer, television/film set of the [employer].  PATIENT NAME:  [name], [name] MR#: [id number]

## 2014-09-25 NOTE — H&P (View-Only) (Signed)
Primary Care Physician: Robert Bellow, MD  Primary Gastroenterologist:  Garfield Cornea, MD   Chief Complaint  Patient presents with  . Follow-up    HPI: Anna Mcconnell is a 80 y.o. female here for followup of constipation. She was last seen on September 21. On rectal exam she had large hemorrhoid noted externally. On digital exam she was noted to have what felt to be a stool ball but was not within reach to determine, seems to be mobile but rectal mass could not be excluded. She was heme-negative. Started on Linzess after a Miralax purge/Fleet enema. She presents back today for repeat rectal exam. She is due for routine colonoscopy 2017, did not want to pursue right now due to transportation concerns unless necessary.  Linzess every day. Has a BM about every day but takes few attempts to get it out. Feels pressure in rectum. Some gas. Feels like never gets rectum empty. No abdominal pain, vomiting, anorexia, heartburn.  Current Outpatient Prescriptions  Medication Sig Dispense Refill  . atenolol (TENORMIN) 50 MG tablet 50 mg daily.       . hydrochlorothiazide (HYDRODIURIL) 25 MG tablet 25 mg daily.       . Linaclotide (LINZESS) 290 MCG CAPS capsule Take 1 capsule (290 mcg total) by mouth daily. On empty stomach  30 capsule  5  . LORazepam (ATIVAN) 1 MG tablet 1 mg. Only as needed     Not even every daily      . naproxen (NAPROSYN) 500 MG tablet 500 mg 2 (two) times daily with a meal. Takes once daily      . Probiotic Product (PROBIOTIC DAILY PO) Take by mouth.      . traMADol (ULTRAM) 50 MG tablet 50 mg daily.       . Vitamin D, Ergocalciferol, (DRISDOL) 50000 UNITS CAPS capsule Take 50,000 Units by mouth every 7 (seven) days.       No current facility-administered medications for this visit.    Allergies as of 09/06/2014  . (No Known Allergies)   Past Medical History  Diagnosis Date  . HTN (hypertension)   . Spinal stenosis   . Arthritis    Past Surgical History    Procedure Laterality Date  . Colonoscopy  09/06/2006    [id number] rectum.  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  Sigmoid diverticula.  The remainder of the colonic mucosa normal  . Hysterectomy    . Varicose vein ligation    . Cholecystectomy     Family History  Problem Relation Age of Onset  . Colon cancer Neg Hx   . Tuberculosis Mother     deceased at young age  . Tuberculosis Father     deceased at young age   History   Social History  . Marital Status: Widowed    Spouse Name: N/A    Number of Children: N/A  . Years of Education: N/A   Social History Main Topics  . Smoking status: Former Research scientist (life sciences)  . Smokeless tobacco: None     Comment: Quit smoking x 25 years  . Alcohol Use: None  . Drug Use: None  . Sexual Activity: None   Other Topics Concern  . None   Social History Narrative  . None    ROS:  General: Negative for anorexia, weight loss, fever, chills, fatigue, weakness. ENT: Negative for hoarseness, difficulty swallowing , nasal congestion. CV: Negative for chest pain, angina, palpitations, dyspnea on exertion, peripheral edema.  Respiratory:  Negative for dyspnea at rest, dyspnea on exertion, cough, sputum, wheezing.  GI: See history of present illness. GU:  Negative for dysuria, hematuria, urinary incontinence, urinary frequency, nocturnal urination.  Endo: Negative for unusual weight change.    Physical Examination:   BP 111/66  Pulse 60  Temp(Src) 98.2 F (36.8 C) (Oral)  Ht 5\' 4"  (1.626 m)  Wt 193 lb (87.544 kg)  BMI 33.11 kg/m2  General: Well-nourished, well-developed in no acute distress.  Eyes: No icterus. Mouth: Oropharyngeal mucosa moist and pink , no lesions erythema or exudate. Lungs: Clear to auscultation bilaterally.  Heart: Regular rate and rhythm, no murmurs rubs or gallops.  Abdomen: Bowel sounds are normal, nontender, nondistended, no hepatosplenomegaly or masses, no abdominal bruits or hernia , no rebound or  guarding.   RECTAL: large hemorrhoids externally, nonthrombosed, no erythema. Proximally within rectal canal, palpable mass still persists, stool noted on glove. Could not reach around the mass to determine if stool. Somewhat tender on exam. Extremities: No lower extremity edema. No clubbing or deformities. Neuro: Alert and oriented x 4   Skin: Warm and dry, no jaundice.   Psych: Alert and cooperative, normal mood and affect.

## 2014-09-25 NOTE — Discharge Instructions (Addendum)
Colonoscopy Discharge Instructions  Read the instructions outlined below and refer to this sheet in the next few weeks. These discharge instructions provide you with general information on caring for yourself after you leave the hospital. Your doctor may also give you specific instructions. While your treatment has been planned according to the most current medical practices available, unavoidable complications occasionally occur. If you have any problems or questions after discharge, call Dr. Gala Romney at [phone]. ACTIVITY  You may resume your regular activity, but move at a slower pace for the next 24 hours.   Take frequent rest periods for the next 24 hours.   Walking will help get rid of the air and reduce the bloated feeling in your belly (abdomen).   No driving for 24 hours (because of the medicine (anesthesia) used during the test).    Do not sign any important legal documents or operate any machinery for 24 hours (because of the anesthesia used during the test).  NUTRITION  Drink plenty of fluids.   You may resume your normal diet as instructed by your doctor.   Begin with a light meal and progress to your normal diet. Heavy or fried foods are harder to digest and may make you feel sick to your stomach (nauseated).   Avoid alcoholic beverages for 24 hours or as instructed.  MEDICATIONS  You may resume your normal medications unless your doctor tells you otherwise.  WHAT YOU CAN EXPECT TODAY  Some feelings of bloating in the abdomen.   Passage of more gas than usual.   Spotting of blood in your stool or on the toilet paper.  IF YOU HAD POLYPS REMOVED DURING THE COLONOSCOPY:  No aspirin products for 7 days or as instructed.   No alcohol for 7 days or as instructed.   Eat a soft diet for the next 24 hours.  FINDING OUT THE RESULTS OF YOUR TEST Not all test results are available during your visit. If your test results are not back during the visit, make an appointment  with your caregiver to find out the results. Do not assume everything is normal if you have not heard from your caregiver or the medical facility. It is important for you to follow up on all of your test results.  SEEK IMMEDIATE MEDICAL ATTENTION IF:  You have more than a spotting of blood in your stool.   Your belly is swollen (abdominal distention).   You are nauseated or vomiting.   You have a temperature over 101.   You have abdominal pain or discomfort that is severe or gets worse throughout the day.   Constipation information provided  Continue Linzess 290 daily; Miralax 17 grams twice daily  Office visit with [place] in 4 weeks.   Constipation Constipation is when a person has fewer than three bowel movements a week, has difficulty having a bowel movement, or has stools that are dry, hard, or larger than normal. As people grow older, constipation is more common. If you try to fix constipation with medicines that make you have a bowel movement (laxatives), the problem may get worse. Long-term laxative use may cause the muscles of the colon to become weak. A low-fiber diet, not taking in enough fluids, and taking certain medicines may make constipation worse.  CAUSES   Certain medicines, such as antidepressants, pain medicine, iron supplements, antacids, and water pills.   Certain diseases, such as diabetes, irritable bowel syndrome (IBS), thyroid disease, or depression.   Not drinking enough water.  Not eating enough fiber-rich foods.   Stress or travel.   Lack of physical activity or exercise.   Ignoring the urge to have a bowel movement.   Using laxatives too much.  SIGNS AND SYMPTOMS   Having fewer than three bowel movements a week.   Straining to have a bowel movement.   Having stools that are hard, dry, or larger than normal.   Feeling full or bloated.   Pain in the lower abdomen.   Not feeling relief after having a bowel movement.  DIAGNOSIS    Your health care provider will take a medical history and perform a physical exam. Further testing may be done for severe constipation. Some tests may include:  A barium enema X-ray to examine your rectum, colon, and, sometimes, your small intestine.   A sigmoidoscopy to examine your lower colon.   A colonoscopy to examine your entire colon. TREATMENT  Treatment will depend on the severity of your constipation and what is causing it. Some dietary treatments include drinking more fluids and eating more fiber-rich foods. Lifestyle treatments may include regular exercise. If these diet and lifestyle recommendations do not help, your health care provider may recommend taking over-the-counter laxative medicines to help you have bowel movements. Prescription medicines may be prescribed if over-the-counter medicines do not work.  HOME CARE INSTRUCTIONS   Eat foods that have a lot of fiber, such as fruits, vegetables, whole grains, and beans.  Limit foods high in fat and processed sugars, such as french fries, hamburgers, cookies, candies, and soda.   A fiber supplement may be added to your diet if you cannot get enough fiber from foods.   Drink enough fluids to keep your urine clear or pale yellow.   Exercise regularly or as directed by your health care provider.   Go to the restroom when you have the urge to go. Do not hold it.   Only take over-the-counter or prescription medicines as directed by your health care provider. Do not take other medicines for constipation without talking to your health care provider first.  [place] IF:   You have bright red blood in your stool.   Your constipation lasts for more than 4 days or gets worse.   You have abdominal or rectal pain.   You have thin, pencil-like stools.   You have unexplained weight loss. MAKE SURE YOU:   Understand these instructions.  Will watch your condition.  Will get help right away if you  are not doing well or get worse. Document Released: 08/14/2004 Document Revised: 11/21/2013 Document Reviewed: 08/28/2013 [facility] Patient Information 2015 [place], [place]. This information is not intended to replace advice given to you by your health care provider. Make sure you discuss any questions you have with your health care provider.

## 2014-09-25 NOTE — Interval H&P Note (Signed)
History and Physical Interval Note:  09/25/2014 2:25 PM  Anna Mcconnell  has presented today for surgery, with the diagnosis of rectal mass/rectal bleeding/constipation  The various methods of treatment have been discussed with the patient and family. After consideration of risks, benefits and other options for treatment, the patient has consented to  Procedure(s) with comments: COLONOSCOPY (N/A) - 1448  as a surgical intervention .  The patient's history has been reviewed, patient examined, no change in status, stable for surgery.  I have reviewed the patient's chart and labs.  Questions were answered to the patient's satisfaction.     Robert Rourk No change; The risks, benefits, limitations, alternatives and imponderables have been reviewed with the patient. Questions have been answered. All parties are agreeable.

## 2014-09-26 ENCOUNTER — Telehealth: Payer: Self-pay | Admitting: Internal Medicine

## 2014-09-26 NOTE — Telephone Encounter (Signed)
Pt was scheduled with [facility] per RMR on 11/30 and [facility] schedule has changed and patient needs to be rescheduled. Erline Levine tried to reschedule patient in next available with [facility] and patient can't schedule in DEC because she is going to stay with her daughter for a month out of state and wants to be seen before she leaves. Please advise if we can use an URG spot. For now she is on the schedule for DEC 3 at 2pm with [facility] but needs something before then.

## 2014-09-26 NOTE — Telephone Encounter (Signed)
Urgent spot ok.

## 2014-09-26 NOTE — Telephone Encounter (Signed)
Patient scheduled and she is aware of date and time

## 2014-09-27 ENCOUNTER — Encounter (HOSPITAL_COMMUNITY): Payer: Self-pay | Admitting: Internal Medicine

## 2014-10-11 ENCOUNTER — Encounter: Payer: Self-pay | Admitting: *Deleted

## 2014-10-23 ENCOUNTER — Ambulatory Visit (INDEPENDENT_AMBULATORY_CARE_PROVIDER_SITE_OTHER): Payer: Medicare Other | Admitting: Gastroenterology

## 2014-10-23 ENCOUNTER — Encounter: Payer: Self-pay | Admitting: Gastroenterology

## 2014-10-23 VITALS — BP 131/78 | HR 61 | Temp 97.0°F | Ht 63.0 in | Wt 195.0 lb

## 2014-10-23 DIAGNOSIS — K5900 Constipation, unspecified: Secondary | ICD-10-CM

## 2014-10-23 NOTE — Assessment & Plan Note (Signed)
Chronic constipation more difficult to manage over the past several months. Recent palpable mass in the rectum must have been retained stool. DRE was repeated multiple times with it still present but nothing abnormal in rectum on incomplete colonoscopy. She feels well. BM 2 daily at this time. Going out of town for one month. Will continue current bowel regimen but will try to get cheaper option once her new insurance takes effect in 11/2014. Samples of Linzess provided. Not enough 284mcg for the month but she will take 165mcg daily once she runs out and can increase her miralax to BID if needed. She will call upon her return in 11/2014. Will discuss with Dr. Gala Romney to see if he recommends carrying out complete colonoscopy for bowel habit change.

## 2014-10-23 NOTE — Progress Notes (Signed)
Primary Care Physician: Robert Bellow, MD  Primary Gastroenterologist:  Garfield Cornea, MD   Chief Complaint  Patient presents with  . Follow-up    HPI: Anna Mcconnell is a 80 y.o. female here for follow up. She recently had attempted colonoscopy for rectal mass noted multiple times on DRE. Rectal exam by colonoscope was normal. Exam limited to sigmoid colon due to bolus of solid stool which occluded the lumen. Patient reports that she had taken entire prep and enema and had passed watery stool by the end of the prep.  She has been maintained on Linzess 267mcg every morning. And takes miralax at night. BM every morning. Usually at least two stools daily. Tramadol once daily. Linzess too expensive to continue, at least 60-90 dollars per month. Last complete colonoscopy in 2007 without polyps.  Current Outpatient Prescriptions  Medication Sig Dispense Refill  . atenolol (TENORMIN) 50 MG tablet Take 50 mg by mouth daily.     . hydrochlorothiazide (HYDRODIURIL) 25 MG tablet Take 25 mg by mouth daily as needed (fluid).     . Linaclotide (LINZESS) 290 MCG CAPS capsule Take 1 capsule (290 mcg total) by mouth daily. On empty stomach 30 capsule 5  . LORazepam (ATIVAN) 1 MG tablet Take 1 mg by mouth daily as needed for anxiety. Only as needed     Not even every daily    . polyethylene glycol (MIRALAX / GLYCOLAX) packet Take 17 g by mouth daily.    . Probiotic Product (PROBIOTIC DAILY PO) Take 1 capsule by mouth daily.     . traMADol (ULTRAM) 50 MG tablet Take 50 mg by mouth daily as needed (pain).     . Vitamin D, Ergocalciferol, (DRISDOL) 50000 UNITS CAPS capsule Take 50,000 Units by mouth every 7 (seven) days.     No current facility-administered medications for this visit.    Allergies as of 10/23/2014 - Review Complete 10/23/2014  Allergen Reaction Noted  . Psyllium Other (See Comments) 09/11/2014   Past Medical History  Diagnosis Date  . HTN (hypertension)   . Spinal  stenosis   . Arthritis    Past Surgical History  Procedure Laterality Date  . Colonoscopy  09/06/2006    GHW:EXHBZJ rectum.  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  Sigmoid diverticula.  The remainder of the colonic mucosa normal  . Hysterectomy    . Varicose vein ligation    . Cholecystectomy    . Colonoscopy N/A 09/25/2014    RMR:No rectal mass on digital rectal exam or with colonoscopic visualization. Internal hemorrhoids. Poor prep precluded complete examination, limited to sigmoid colon.   Family History  Problem Relation Age of Onset  . Colon cancer Neg Hx   . Tuberculosis Mother     deceased at young age  . Tuberculosis Father     deceased at young age   History   Social History  . Marital Status: Widowed    Spouse Name: N/A    Number of Children: N/A  . Years of Education: N/A   Social History Main Topics  . Smoking status: Former Research scientist (life sciences)  . Smokeless tobacco: None     Comment: Quit smoking x 25 years  . Alcohol Use: None  . Drug Use: None  . Sexual Activity: None   Other Topics Concern  . None   Social History Narrative    ROS:  General: Negative for anorexia, weight loss, fever, chills, fatigue, weakness. ENT: Negative for hoarseness, difficulty swallowing ,  nasal congestion. CV: Negative for chest pain, angina, palpitations, dyspnea on exertion, peripheral edema.  Respiratory: Negative for dyspnea at rest, dyspnea on exertion, cough, sputum, wheezing.  GI: See history of present illness. GU:  Negative for dysuria, hematuria, urinary incontinence, urinary frequency, nocturnal urination.  Endo: Negative for unusual weight change.    Physical Examination:   BP 131/78 mmHg  Pulse 61  Temp(Src) 97 F (36.1 C) (Oral)  Ht 5\' 3"  (1.6 m)  Wt 195 lb (88.451 kg)  BMI 34.55 kg/m2  General: Well-nourished, well-developed in no acute distress.  Eyes: No icterus. Mouth: Oropharyngeal mucosa moist and pink , no lesions erythema or  exudate. Lungs: Clear to auscultation bilaterally.  Heart: Regular rate and rhythm, no murmurs rubs or gallops.  Abdomen: Bowel sounds are normal, nontender, nondistended, no hepatosplenomegaly or masses, no abdominal bruits or hernia , no rebound or guarding.   Extremities: No lower extremity edema. No clubbing or deformities. Neuro: Alert and oriented x 4   Skin: Warm and dry, no jaundice.   Psych: Alert and cooperative, normal mood and affect.

## 2014-10-23 NOTE — Patient Instructions (Signed)
1. Continue Linzess. We did not have 30 Linzess 229mcg but we did have 167mcg. Once you run our of the 235mcg capsules, start the 161mcg capsules in the morning. If you notice your bowel movements are slowing down, you can increase your miralax to twice daily.  2. Call me when you get back to town. We will discuss any change in your medication that may be needed and whether Dr. Gala Romney advises you to try for a complete colonoscopy.

## 2014-10-24 ENCOUNTER — Telehealth: Payer: Self-pay | Admitting: *Deleted

## 2014-10-24 NOTE — Telephone Encounter (Signed)
PA  APPROVED LINZESS 290 MCG CAPSULES

## 2014-10-24 NOTE — Progress Notes (Signed)
cc'ed to pcp °

## 2014-10-29 ENCOUNTER — Ambulatory Visit: Payer: Medicare Other | Admitting: Gastroenterology

## 2014-11-01 ENCOUNTER — Ambulatory Visit: Payer: Medicare Other | Admitting: Gastroenterology

## 2014-12-11 ENCOUNTER — Encounter: Payer: Self-pay | Admitting: Orthopedic Surgery

## 2014-12-11 ENCOUNTER — Ambulatory Visit (INDEPENDENT_AMBULATORY_CARE_PROVIDER_SITE_OTHER): Payer: PPO | Admitting: Orthopedic Surgery

## 2014-12-11 VITALS — BP 128/72 | Ht 63.0 in | Wt 195.0 lb

## 2014-12-11 DIAGNOSIS — M1711 Unilateral primary osteoarthritis, right knee: Secondary | ICD-10-CM | POA: Insufficient documentation

## 2014-12-11 DIAGNOSIS — M129 Arthropathy, unspecified: Secondary | ICD-10-CM

## 2014-12-11 MED ORDER — CELECOXIB 200 MG PO CAPS: 200.0000 mg | ORAL_CAPSULE | Freq: Two times a day (BID) | ORAL | Status: DC

## 2014-12-11 NOTE — Patient Instructions (Signed)

## 2014-12-11 NOTE — Progress Notes (Signed)
Patient ID: Anna Mcconnell, female   DOB: August 12, 1931, 81 y.o.   MRN: [id number] Chief Complaint  Patient presents with  . Follow-up    follow up bilateral knee pain, last ov 07/12/14, Rt>Lt   The patient has history of osteoarthritis of the knees. Today for right knee reevaluation.  She cannot have surgery  She went to [place] did well while visiting. She comes back for reevaluation.   System review the knee will go out on her at times but her primary complaint is pain  Ambulation is without support other than a knee sleeve Knee is tender and lateral compartment shows slight valgus Flexion angle 120 Stability tests are normal Quadriceps muscle tone strength normal Skin normal Distal pulses normal  Encounter Diagnosis  Name Primary?  Marland Kitchen Arthritis of knee, right Yes    Procedure note right knee injection verbal consent was obtained to inject right knee joint  Timeout was completed to confirm the site of injection  The medications used were 40 mg of Depo-Medrol and 1% lidocaine 3 cc  Anesthesia was provided by ethyl chloride and the skin was prepped with alcohol.  After cleaning the skin with alcohol a 20-gauge needle was used to inject the right knee joint. There were no complications. A sterile bandage was applied.

## 2014-12-12 ENCOUNTER — Telehealth: Payer: Self-pay

## 2014-12-12 NOTE — Telephone Encounter (Signed)
Pt called and made an appt for 01/08/2015 with [facility].  States she may need some samples of the Linzess to get her through to her appointment.

## 2014-12-12 NOTE — Telephone Encounter (Signed)
OK to give samples. It is not clear if patient was on 145 or 290 most recently. Whichever she wants and we have you may given her 3 weeks worth.

## 2014-12-12 NOTE — Telephone Encounter (Signed)
Noted and pt is aware that samples are ready for pick up.

## 2014-12-18 ENCOUNTER — Telehealth: Payer: Self-pay | Admitting: Orthopedic Surgery

## 2014-12-18 NOTE — Telephone Encounter (Signed)
Patient is calling stating that the prescription for Celebrex that was given by Dr. Aline Brochure is to expensive for her and she is asking for a cheaper medication please advise?

## 2014-12-18 NOTE — Telephone Encounter (Signed)
advil OTC 200 mg q 8 hrs

## 2014-12-18 NOTE — Telephone Encounter (Signed)
Routing to Dr Harrison 

## 2015-01-08 ENCOUNTER — Ambulatory Visit (INDEPENDENT_AMBULATORY_CARE_PROVIDER_SITE_OTHER): Payer: PPO | Admitting: Gastroenterology

## 2015-01-08 ENCOUNTER — Encounter: Payer: Self-pay | Admitting: Gastroenterology

## 2015-01-08 VITALS — BP 130/75 | HR 50 | Temp 97.4°F | Ht 63.0 in | Wt 196.0 lb

## 2015-01-08 DIAGNOSIS — K5900 Constipation, unspecified: Secondary | ICD-10-CM

## 2015-01-08 MED ORDER — LINACLOTIDE 290 MCG PO CAPS: 290.0000 ug | ORAL_CAPSULE | Freq: Every day | ORAL | Status: DC

## 2015-01-08 NOTE — Assessment & Plan Note (Signed)
Doing well with current bowel regimen. Continue Linzess 261mcg daily with miralax daily. She will call when she is ready to schedule colonoscopy. Would recommend two day clear liquids and split dose bowel regimen. May require additional bowel prep depending on bowel function at that time. She will need OV prior to TCS. Call sooner if any problems.

## 2015-01-08 NOTE — Patient Instructions (Signed)
1. Continue Linzess once daily and miralax at bedtime. 2. I sent in RX for Linzess, call the pharmacy to see how much it will cost you. Let me know if you need any assistance. 3. Call when you have your dates for your daughter's visit and we will get you on the schedule for a colonoscopy. You will need an office visit prior to the procedure.

## 2015-01-08 NOTE — Progress Notes (Signed)
Primary Care Physician:  Robert Bellow, MD  Primary Gastroenterologist:  Garfield Cornea, MD   Chief Complaint  Patient presents with  . Follow-up    HPI:  Anna Mcconnell is a 81 y.o. female here for follow up. Last seen in 09/2014. Attempted colonoscopy for ?rectal mass back in 08/2014. Rectal exam by colonoscopy was normal but exam limited to sigmoid colon due to bolus of solid stool which occluded the lumen. She has been doing well over the past couple of months. Spent one month with her daughter over the [place]. Has continued to have daily BMs with Linzess 24mcg daily/miralax daily. Recently started celebrex for arthritis. Holding naproxen for now. Denies abdominal pain, heartburn, melena, brbpr.   Wants to hold off on colonoscopy until after 04/2014 when her daughter comes to visit. Plans were for complete TCS due to bowel habit change.    Current Outpatient Prescriptions  Medication Sig Dispense Refill  . atenolol (TENORMIN) 50 MG tablet Take 50 mg by mouth daily.     . celecoxib (CELEBREX) 200 MG capsule Take 1 capsule (200 mg total) by mouth 2 (two) times daily. 60 capsule 4  . diclofenac sodium (VOLTAREN) 1 % GEL Apply topically 4 (four) times daily.    . hydrochlorothiazide (HYDRODIURIL) 25 MG tablet Take 25 mg by mouth daily as needed (fluid).     . Linaclotide (LINZESS) 290 MCG CAPS capsule Take 1 capsule (290 mcg total) by mouth daily. On empty stomach 30 capsule 5  . LORazepam (ATIVAN) 1 MG tablet Take 1 mg by mouth daily as needed for anxiety. Only as needed     Not even every daily    . Misc Natural Products (CURCUMAX PRO PO) Take by mouth.    . naproxen (NAPROSYN) 500 MG tablet Take 500 mg by mouth.    . polyethylene glycol (MIRALAX / GLYCOLAX) packet Take 17 g by mouth daily.    . Probiotic Product (PROBIOTIC DAILY PO) Take 1 capsule by mouth daily.     . traMADol (ULTRAM) 50 MG tablet Take 50 mg by mouth daily as needed (pain).     . Vitamin D, Ergocalciferol,  (DRISDOL) 50000 UNITS CAPS capsule Take 50,000 Units by mouth every 7 (seven) days.     No current facility-administered medications for this visit.    Allergies as of 01/08/2015 - Review Complete 01/08/2015  Allergen Reaction Noted  . Psyllium Other (See Comments) 09/11/2014    Past Medical History  Diagnosis Date  . HTN (hypertension)   . Spinal stenosis   . Arthritis     Past Surgical History  Procedure Laterality Date  . Colonoscopy  09/06/2006    TML:YYTKPT rectum.  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  Sigmoid diverticula.  The remainder of the colonic mucosa normal  . Hysterectomy    . Varicose vein ligation    . Cholecystectomy    . Colonoscopy N/A 09/25/2014    RMR:No rectal mass on digital rectal exam or with colonoscopic visualization. Internal hemorrhoids. Poor prep precluded complete examination, limited to sigmoid colon.    Family History  Problem Relation Age of Onset  . Colon cancer Neg Hx   . Tuberculosis Mother     deceased at young age  . Tuberculosis Father     deceased at young age    History   Social History  . Marital Status: Widowed    Spouse Name: N/A    Number of Children: N/A  . Years of Education: N/A  Occupational History  . Not on file.   Social History Main Topics  . Smoking status: Former Research scientist (life sciences)  . Smokeless tobacco: Not on file     Comment: Quit smoking x 25 years  . Alcohol Use: Not on file  . Drug Use: Not on file  . Sexual Activity: Not on file   Other Topics Concern  . Not on file   Social History Narrative      ROS:  General: Negative for anorexia, weight loss, fever, chills, fatigue, weakness. Eyes: Negative for vision changes.  ENT: Negative for hoarseness, difficulty swallowing , nasal congestion. CV: Negative for chest pain, angina, palpitations, dyspnea on exertion, peripheral edema.  Respiratory: Negative for dyspnea at rest, dyspnea on exertion, cough, sputum, wheezing.  GI: See  history of present illness. GU:  Negative for dysuria, hematuria, urinary incontinence, urinary frequency, nocturnal urination.  MS: +for joint pain, No low back pain.  Derm: Negative for rash or itching.  Neuro: Negative for weakness, abnormal sensation, seizure, frequent headaches, memory loss, confusion.  Psych: Negative for anxiety, depression, suicidal ideation, hallucinations.  Endo: Negative for unusual weight change.  Heme: Negative for bruising or bleeding. Allergy: Negative for rash or hives.    Physical Examination:  BP 130/75 mmHg  Pulse 50  Temp(Src) 97.4 F (36.3 C) (Oral)  Ht 5\' 3"  (1.6 m)  Wt 196 lb (88.905 kg)  BMI 34.73 kg/m2   General: Well-nourished, well-developed in no acute distress.  Head: Normocephalic, atraumatic.   Eyes: Conjunctiva pink, no icterus. Mouth: Oropharyngeal mucosa moist and pink , no lesions erythema or exudate. Neck: Supple without thyromegaly, masses, or lymphadenopathy.  Lungs: Clear to auscultation bilaterally.  Heart: Regular rate and rhythm, no murmurs rubs or gallops.  Abdomen: Bowel sounds are normal, nontender, nondistended, no hepatosplenomegaly or masses, no abdominal bruits or    hernia , no rebound or guarding.   Rectal: not performed Extremities: No lower extremity edema. No clubbing or deformities.  Neuro: Alert and oriented x 4 , grossly normal neurologically.  Skin: Warm and dry, no rash or jaundice.   Psych: Alert and cooperative, normal mood and affect.  Labs: requested  Imaging Studies: No results found.

## 2015-01-19 NOTE — Progress Notes (Signed)
CC'ED TO PCP 

## 2015-02-27 ENCOUNTER — Telehealth: Payer: Self-pay | Admitting: Internal Medicine

## 2015-02-27 NOTE — Telephone Encounter (Signed)
Patient had called back and spoke with Erline Levine and confirmed that she would accept OV on 4/19 at 55 with [facility]

## 2015-02-27 NOTE — Telephone Encounter (Signed)
Patient came into office this afternoon asking to speak with [facility]. I told her that [facility] was at lunch but I could take a message. Patient said that she has found a ride for her to go and have her colonoscopy. She wants it scheduled on a Tuesday or a Thursday either in April or May.  After reading her last OV from [facility] she is to have OV prior to scheduling procedure. I LMOM for patient that she will need OV first and scheduled it for 4/19 at 11am with [facility] and after that we can get her scheduled for procedure. I asked for a return call to confirm if this date and time would work for her.

## 2015-03-11 ENCOUNTER — Telehealth: Payer: Self-pay

## 2015-03-11 ENCOUNTER — Telehealth: Payer: Self-pay | Admitting: Internal Medicine

## 2015-03-11 NOTE — Telephone Encounter (Signed)
Pt has OV on 4/19 and has 2 linzess left. She is asking to get some samples since rx is so expensive.

## 2015-03-11 NOTE — Telephone Encounter (Signed)
Samples given. See other phone note.

## 2015-03-11 NOTE — Telephone Encounter (Signed)
Noted  

## 2015-03-11 NOTE — Progress Notes (Addendum)
Labs from 12/2014: Bun 19, cre 0.99 LFTs normal. WBC 9200, H/H 14/43.4, Plate 196,000

## 2015-03-11 NOTE — Telephone Encounter (Signed)
Pt called requesting linzess 221mcg samples. #3 boxes are at the front desk for the pt to pick up. Pt has upcoming ov. Pt is aware.

## 2015-03-19 ENCOUNTER — Other Ambulatory Visit: Payer: Self-pay

## 2015-03-19 ENCOUNTER — Encounter: Payer: Self-pay | Admitting: Gastroenterology

## 2015-03-19 ENCOUNTER — Ambulatory Visit (INDEPENDENT_AMBULATORY_CARE_PROVIDER_SITE_OTHER): Payer: PPO | Admitting: Gastroenterology

## 2015-03-19 VITALS — BP 108/67 | HR 53 | Temp 97.6°F | Ht 63.0 in | Wt 201.2 lb

## 2015-03-19 DIAGNOSIS — K5900 Constipation, unspecified: Secondary | ICD-10-CM

## 2015-03-19 DIAGNOSIS — R194 Change in bowel habit: Secondary | ICD-10-CM

## 2015-03-19 MED ORDER — PEG 3350-KCL-NA BICARB-NACL 420 G PO SOLR: 4000.0000 mL | Freq: Once | ORAL | Status: DC

## 2015-03-19 NOTE — Progress Notes (Signed)
Primary Care Physician:  Robert Bellow, MD  Primary Gastroenterologist:  Garfield Cornea, MD   Chief Complaint  Patient presents with  . Follow-up    HPI:  Anna Mcconnell is a 81 y.o. female here for follow up and to schedule colonoscopy. She has h/o change in bowel habits/constipation which we started seeing her for in the fall of 2015. She had attempted colonoscopy for suspected rectal mass in 08/2014 after repeated rectal exam was abnormal. On procedure, exam was limited to the sigmoid colon due to bolus of solid stool which occluded the lumen. She was advised to consider complete colonoscopy at a later date for bowel habit change.  She has been on Linzess 290 g daily, MiraLAX as needed (but takes most evenings). She's having a daily bowel movement and doing very well. Constipation well-managed. Denies abdominal pain, melena, rectal bleeding, heartburn, weight loss. Most of her concerns are related to arthritis.  She is still very interested in pursuing a complete colonoscopy given the significant change in her bowel habits over the last 6 months or so.   Current Outpatient Prescriptions  Medication Sig Dispense Refill  . atenolol (TENORMIN) 50 MG tablet Take 50 mg by mouth daily.     . celecoxib (CELEBREX) 200 MG capsule Take 1 capsule (200 mg total) by mouth 2 (two) times daily. 60 capsule 4  . diclofenac sodium (VOLTAREN) 1 % GEL Apply topically 4 (four) times daily.    . hydrochlorothiazide (HYDRODIURIL) 25 MG tablet Take 25 mg by mouth daily as needed (fluid).     . Linaclotide (LINZESS) 290 MCG CAPS capsule Take 1 capsule (290 mcg total) by mouth daily. On empty stomach 30 capsule 5  . LORazepam (ATIVAN) 1 MG tablet Take 1 mg by mouth daily as needed for anxiety. Only as needed     Not even every daily    . Misc Natural Products (CURCUMAX PRO PO) Take by mouth.    . naproxen (NAPROSYN) 500 MG tablet Take 500 mg by mouth.    . polyethylene glycol (MIRALAX / GLYCOLAX) packet Take  17 g by mouth daily.    . traMADol (ULTRAM) 50 MG tablet Take 50 mg by mouth daily as needed (pain).     . Vitamin D, Ergocalciferol, (DRISDOL) 50000 UNITS CAPS capsule Take 50,000 Units by mouth every 7 (seven) days.     No current facility-administered medications for this visit.    Allergies as of 03/19/2015 - Review Complete 03/19/2015  Allergen Reaction Noted  . Psyllium Other (See Comments) 09/11/2014    Past Medical History  Diagnosis Date  . HTN (hypertension)   . Spinal stenosis   . Arthritis     Past Surgical History  Procedure Laterality Date  . Colonoscopy  09/06/2006    OVZ:CHYIFO rectum.  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  Sigmoid diverticula.  The remainder of the colonic mucosa normal  . Hysterectomy    . Varicose vein ligation    . Cholecystectomy    . Colonoscopy N/A 09/25/2014    RMR:No rectal mass on digital rectal exam or with colonoscopic visualization. Internal hemorrhoids. Poor prep precluded complete examination, limited to sigmoid colon.    Family History  Problem Relation Age of Onset  . Colon cancer Neg Hx   . Tuberculosis Mother     deceased at young age  . Tuberculosis Father     deceased at young age    History   Social History  . Marital Status: Widowed  Spouse Name: N/A  . Number of Children: N/A  . Years of Education: N/A   Occupational History  . Not on file.   Social History Main Topics  . Smoking status: Former Research scientist (life sciences)  . Smokeless tobacco: Not on file     Comment: Quit smoking x 25 years  . Alcohol Use: Not on file  . Drug Use: Not on file  . Sexual Activity: Not on file   Other Topics Concern  . Not on file   Social History Narrative      ROS:  General: Negative for anorexia, weight loss, fever, chills, fatigue, weakness. Eyes: Negative for vision changes.  ENT: Negative for hoarseness, difficulty swallowing , nasal congestion. CV: Negative for chest pain, angina, palpitations,  dyspnea on exertion, peripheral edema.  Respiratory: Negative for dyspnea at rest, dyspnea on exertion, cough, sputum, wheezing.  GI: See history of present illness. GU:  Negative for dysuria, hematuria, urinary incontinence, urinary frequency, nocturnal urination.  MS: Negative for joint pain, low back pain.  Derm: Negative for rash or itching.  Neuro: Negative for weakness, abnormal sensation, seizure, frequent headaches, memory loss, confusion.  Psych: Negative for anxiety, depression, suicidal ideation, hallucinations.  Endo: Negative for unusual weight change.  Heme: Negative for bruising or bleeding. Allergy: Negative for rash or hives.    Physical Examination:  BP 108/67 mmHg  Pulse 53  Temp(Src) 97.6 F (36.4 C) (Oral)  Ht 5\' 3"  (1.6 m)  Wt 201 lb 3.2 oz (91.264 kg)  BMI 35.65 kg/m2   General: Well-nourished, well-developed in no acute distress.  Head: Normocephalic, atraumatic.   Eyes: Conjunctiva pink, no icterus. Mouth: Oropharyngeal mucosa moist and pink , no lesions erythema or exudate. Neck: Supple without thyromegaly, masses, or lymphadenopathy.  Lungs: Clear to auscultation bilaterally.  Heart: Regular rate and rhythm, no murmurs rubs or gallops.  Abdomen: Bowel sounds are normal, nontender, nondistended, no hepatosplenomegaly or masses, no abdominal bruits or    hernia , no rebound or guarding.   Rectal: not performed Extremities: No lower extremity edema. No clubbing or deformities.  Neuro: Alert and oriented x 4 , grossly normal neurologically.  Skin: Warm and dry, no rash or jaundice.   Psych: Alert and cooperative, normal mood and affect.   Imaging Studies: No results found.

## 2015-03-19 NOTE — Assessment & Plan Note (Signed)
Doing very well on current bowel regimen. Will continue Linzess 290 g daily along with MiraLAX daily. We continued to provide samples of Linzess to the patient. She is ready to pursue colonoscopy at this time for change in bowel habits. Previous colonoscopy was unsuccessful due to large bolus of stool in the sigmoid colon occluding the lumen.  At this time she is having daily bowel movements on current regimen. She will have a 2 day clear liquid regimen along with split bowel prep (TriLyte). I have discussed the risks, alternatives, benefits with regards to but not limited to the risk of reaction to medication, bleeding, infection, perforation and the patient is agreeable to proceed. Written consent to be obtained.

## 2015-03-19 NOTE — Patient Instructions (Signed)
1. Colonoscopy with Dr. Gala Romney. See separate instructions.  2. Continue Linzess and MiraLAX as before.

## 2015-03-19 NOTE — Progress Notes (Signed)
CC'ED TO PCP 

## 2015-04-04 ENCOUNTER — Ambulatory Visit (INDEPENDENT_AMBULATORY_CARE_PROVIDER_SITE_OTHER): Payer: PPO | Admitting: Orthopedic Surgery

## 2015-04-04 ENCOUNTER — Encounter: Payer: Self-pay | Admitting: Orthopedic Surgery

## 2015-04-04 VITALS — BP 127/77 | Ht 63.0 in | Wt 201.2 lb

## 2015-04-04 DIAGNOSIS — G5602 Carpal tunnel syndrome, left upper limb: Secondary | ICD-10-CM

## 2015-04-04 MED ORDER — VITAMIN B-6 100 MG PO TABS: 100.0000 mg | ORAL_TABLET | Freq: Two times a day (BID) | ORAL | Status: AC

## 2015-04-04 MED ORDER — GABAPENTIN 100 MG PO CAPS: 100.0000 mg | ORAL_CAPSULE | Freq: Every day | ORAL | Status: DC

## 2015-04-04 NOTE — Progress Notes (Signed)
Patient ID: Anna Mcconnell, female   DOB: July 13, 1931, 81 y.o.   MRN: [id number] Established patient new problem  This 81 year old female complains of pain in her left hand. She has a history of previous carpal tunnel syndrome when she was working many years ago also she has been wearing her wrist splint primarily during the day whenever she's not using water such as cleaning or washing dishes. Primary symptoms are in the index long and ring finger thumb mild. She notices numbness tingling and pain which seems to be worsened by activity or wrist flexion position  System review related numbness tingling as stated musculoskeletal weakness minimal constitutional symptoms negative fever chills  History past history family history social history reviewed and updated and recorded in Epic  Vital signs are stable appearance is normal she is oriented 3 mood is normal gait and station are unaffected  Left wrist and hand examination show mild arthritic changes in the small joints of the hand but she exhibits full range of motion hand and wrist she did have some tenderness over the FCR tendon and over the carpal tunnel carpal tunnel compression test increase her symptoms wrist flexion test did not wrist joint stable by [facility] clinic test. Grip strength normal. Skin intact. Pulses good capillary refill normal. Left upper extremity lymph nodes negative. She had decreased sensation in the index long and ring fingers  Diagnosis carpal tunnel syndrome left wrist and hand  Recommend nonoperative treatment with vitamin D 600 mg twice a day for 6 weeks and gabapentin 100 mg at night continue wrist splint  If she doesn't improve then we can do her carpal tunnel release.  She has no risk factors such as thyroid disease or rheumatoid arthritis or diabetes.

## 2015-04-04 NOTE — Patient Instructions (Signed)
Two meds sent to your pharmacy

## 2015-04-08 ENCOUNTER — Telehealth: Payer: Self-pay | Admitting: General Practice

## 2015-04-08 NOTE — Telephone Encounter (Signed)
Patient has a TCS scheduled for this Thursday, she has been having some trouble with her hand and Dr. Aline Brochure put her on Gabapentin 100 mg at bedtime.  She wanted to know if it's okay taht she takes that prior to her TCS.  Please advise?

## 2015-04-08 NOTE — Telephone Encounter (Signed)
I called the patient back and lmom

## 2015-04-08 NOTE — Telephone Encounter (Signed)
Patient made aware.

## 2015-04-08 NOTE — Telephone Encounter (Signed)
That's fine

## 2015-04-11 ENCOUNTER — Ambulatory Visit (HOSPITAL_COMMUNITY)
Admission: RE | Admit: 2015-04-11 | Discharge: 2015-04-11 | Disposition: A | Discharge status: Home / Self Care | Payer: PPO | Source: Ambulatory Visit | Attending: Internal Medicine | Admitting: Internal Medicine

## 2015-04-11 ENCOUNTER — Encounter (HOSPITAL_COMMUNITY): Payer: Self-pay | Admitting: *Deleted

## 2015-04-11 ENCOUNTER — Encounter (HOSPITAL_COMMUNITY)
Admission: RE | Disposition: A | Discharge status: Home / Self Care | Payer: Self-pay | Source: Ambulatory Visit | Attending: Internal Medicine

## 2015-04-11 DIAGNOSIS — K5520 Angiodysplasia of colon without hemorrhage: Secondary | ICD-10-CM | POA: Insufficient documentation

## 2015-04-11 DIAGNOSIS — K5730 Diverticulosis of large intestine without perforation or abscess without bleeding: Secondary | ICD-10-CM | POA: Insufficient documentation

## 2015-04-11 DIAGNOSIS — I10 Essential (primary) hypertension: Secondary | ICD-10-CM | POA: Diagnosis not present

## 2015-04-11 DIAGNOSIS — Z87891 Personal history of nicotine dependence: Secondary | ICD-10-CM | POA: Insufficient documentation

## 2015-04-11 DIAGNOSIS — Z79891 Long term (current) use of opiate analgesic: Secondary | ICD-10-CM | POA: Diagnosis not present

## 2015-04-11 DIAGNOSIS — Z791 Long term (current) use of non-steroidal anti-inflammatories (NSAID): Secondary | ICD-10-CM | POA: Diagnosis not present

## 2015-04-11 DIAGNOSIS — M1990 Unspecified osteoarthritis, unspecified site: Secondary | ICD-10-CM | POA: Insufficient documentation

## 2015-04-11 DIAGNOSIS — Z1211 Encounter for screening for malignant neoplasm of colon: Secondary | ICD-10-CM

## 2015-04-11 DIAGNOSIS — K5900 Constipation, unspecified: Secondary | ICD-10-CM | POA: Diagnosis present

## 2015-04-11 DIAGNOSIS — D120 Benign neoplasm of cecum: Secondary | ICD-10-CM | POA: Diagnosis not present

## 2015-04-11 DIAGNOSIS — Z9049 Acquired absence of other specified parts of digestive tract: Secondary | ICD-10-CM | POA: Insufficient documentation

## 2015-04-11 DIAGNOSIS — Z79899 Other long term (current) drug therapy: Secondary | ICD-10-CM | POA: Insufficient documentation

## 2015-04-11 DIAGNOSIS — R194 Change in bowel habit: Secondary | ICD-10-CM

## 2015-04-11 HISTORY — PX: COLONOSCOPY: SHX5424

## 2015-04-11 SURGERY — COLONOSCOPY
Anesthesia: Moderate Sedation

## 2015-04-11 MED ORDER — MEPERIDINE HCL 100 MG/ML IJ SOLN
INTRAMUSCULAR | Status: DC | PRN
  Administered 2015-04-11 (×2): 25 mg via INTRAVENOUS

## 2015-04-11 MED ORDER — MIDAZOLAM HCL 5 MG/5ML IJ SOLN
INTRAMUSCULAR | Status: AC
  Filled 2015-04-11: qty 10

## 2015-04-11 MED ORDER — ONDANSETRON HCL 4 MG/2ML IJ SOLN
INTRAMUSCULAR | Status: DC | PRN
  Administered 2015-04-11: 4 mg via INTRAVENOUS

## 2015-04-11 MED ORDER — MIDAZOLAM HCL 5 MG/5ML IJ SOLN
INTRAMUSCULAR | Status: DC | PRN
  Administered 2015-04-11 (×2): 2 mg via INTRAVENOUS

## 2015-04-11 MED ORDER — MEPERIDINE HCL 100 MG/ML IJ SOLN
INTRAMUSCULAR | Status: AC
  Filled 2015-04-11: qty 2

## 2015-04-11 MED ORDER — ONDANSETRON HCL 4 MG/2ML IJ SOLN
INTRAMUSCULAR | Status: AC
  Filled 2015-04-11: qty 2

## 2015-04-11 MED ORDER — SODIUM CHLORIDE 0.9 % IV SOLN
INTRAVENOUS | Status: DC
  Administered 2015-04-11: 1000 mL via INTRAVENOUS

## 2015-04-11 MED ORDER — STERILE WATER FOR IRRIGATION IR SOLN
Status: DC | PRN
  Administered 2015-04-11: 12:00:00

## 2015-04-11 NOTE — Op Note (Signed)
[facility] [address] [place], [address]   COLONOSCOPY PROCEDURE REPORT  PATIENT: Anna, Mcconnell  MR#: [id number] BIRTHDATE: Aug 01, 1931 , 71  yrs. old GENDER: female ENDOSCOPIST: R.  Garfield Cornea, MD FACP [facility] REFERRED JI:RCVEL Karie Kirks, M.D. PROCEDURE DATE:  09-May-2015 PROCEDURE:   Colonoscopy with snare polypectomy INDICATIONS:average risk colorectal cancer screening examination; history of possible rectal mass. MEDICATIONS: Versed 4 mg IV and Demerol 50 mg IV in divided doses. Zofran 4 mg IV. ASA CLASS:       Class II  CONSENT: The risks, benefits, alternatives and imponderables including but not limited to bleeding, perforation as well as the possibility of a missed lesion have been reviewed.  The potential for biopsy, lesion removal, etc. have also been discussed. Questions have been answered.  All parties agreeable.  Please see the history and physical in the medical record for more information.  DESCRIPTION OF PROCEDURE:   After the risks benefits and alternatives of the procedure were thoroughly explained, informed consent was obtained.  The digital rectal exam revealed no abnormalities of the rectum.   The EC-[id number] ([id number])  endoscope was introduced through the anus and advanced to the terminal ileum which was intubated for a short distance. No adverse events experienced.   The quality of the prep was adequate  The instrument was then slowly withdrawn as the colon was fully examined.      COLON FINDINGS: Normal-appearing rectal mucosa.  No mass seen. Extensive left-sided and transverse diverticula; (1) 5 mm nonbleeding AVM in the ascending colon.  The patient had (1) 5 mm polyp on the distal side of the ileocecal valve; otherwise, the remainder of the colonic mucosa appeared normal.  The distal 5 cm of terminal ileum mucosa appeared normal.  The above-mentioned polyp was cold snare removed.  Retroflexed views revealed no abnormalities.  .  Withdrawal time=7 minutes 0 seconds.  The scope was withdrawn and the procedure completed. COMPLICATIONS: There were no immediate complications.  ENDOSCOPIC IMPRESSION: Colonic diverticulosis. Single colonic polyp?"removed as described above. Nonbleeding AVM.  RECOMMENDATIONS: Follow-up on pathology. Continue Linzess. Add Benefiber 2 teaspoons twice daily to regimen.  eSigned:  R. Garfield Cornea, MD Rosalita Chessman [facility] 05/09/15 12:31 PM   cc:  CPT CODES: ICD CODES:  The ICD and CPT codes recommended by this software are interpretations from the data that the clinical staff has captured with the software.  The verification of the translation of this report to the ICD and CPT codes and modifiers is the sole responsibility of the health care institution and practicing physician where this report was generated.  [employer] will not be held responsible for the validity of the ICD and CPT codes included on this report.  AMA assumes no liability for data contained or not contained herein. CPT is a Designer, television/film set of the [employer].  PATIENT NAME:  Anna, Mcconnell MR#: [id number]

## 2015-04-11 NOTE — H&P (View-Only) (Signed)
Primary Care Physician:  Robert Bellow, MD  Primary Gastroenterologist:  Garfield Cornea, MD   Chief Complaint  Patient presents with  . Follow-up    HPI:  Anna Mcconnell is a 81 y.o. female here for follow up and to schedule colonoscopy. She has h/o change in bowel habits/constipation which we started seeing her for in the fall of 2015. She had attempted colonoscopy for suspected rectal mass in 08/2014 after repeated rectal exam was abnormal. On procedure, exam was limited to the sigmoid colon due to bolus of solid stool which occluded the lumen. She was advised to consider complete colonoscopy at a later date for bowel habit change.  She has been on Linzess 290 g daily, MiraLAX as needed (but takes most evenings). She's having a daily bowel movement and doing very well. Constipation well-managed. Denies abdominal pain, melena, rectal bleeding, heartburn, weight loss. Most of her concerns are related to arthritis.  She is still very interested in pursuing a complete colonoscopy given the significant change in her bowel habits over the last 6 months or so.   Current Outpatient Prescriptions  Medication Sig Dispense Refill  . atenolol (TENORMIN) 50 MG tablet Take 50 mg by mouth daily.     . celecoxib (CELEBREX) 200 MG capsule Take 1 capsule (200 mg total) by mouth 2 (two) times daily. 60 capsule 4  . diclofenac sodium (VOLTAREN) 1 % GEL Apply topically 4 (four) times daily.    . hydrochlorothiazide (HYDRODIURIL) 25 MG tablet Take 25 mg by mouth daily as needed (fluid).     . Linaclotide (LINZESS) 290 MCG CAPS capsule Take 1 capsule (290 mcg total) by mouth daily. On empty stomach 30 capsule 5  . LORazepam (ATIVAN) 1 MG tablet Take 1 mg by mouth daily as needed for anxiety. Only as needed     Not even every daily    . Misc Natural Products (CURCUMAX PRO PO) Take by mouth.    . naproxen (NAPROSYN) 500 MG tablet Take 500 mg by mouth.    . polyethylene glycol (MIRALAX / GLYCOLAX) packet Take  17 g by mouth daily.    . traMADol (ULTRAM) 50 MG tablet Take 50 mg by mouth daily as needed (pain).     . Vitamin D, Ergocalciferol, (DRISDOL) 50000 UNITS CAPS capsule Take 50,000 Units by mouth every 7 (seven) days.     No current facility-administered medications for this visit.    Allergies as of 03/19/2015 - Review Complete 03/19/2015  Allergen Reaction Noted  . Psyllium Other (See Comments) 09/11/2014    Past Medical History  Diagnosis Date  . HTN (hypertension)   . Spinal stenosis   . Arthritis     Past Surgical History  Procedure Laterality Date  . Colonoscopy  09/06/2006    UXL:KGMWNU rectum.  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  Sigmoid diverticula.  The remainder of the colonic mucosa normal  . Hysterectomy    . Varicose vein ligation    . Cholecystectomy    . Colonoscopy N/A 09/25/2014    RMR:No rectal mass on digital rectal exam or with colonoscopic visualization. Internal hemorrhoids. Poor prep precluded complete examination, limited to sigmoid colon.    Family History  Problem Relation Age of Onset  . Colon cancer Neg Hx   . Tuberculosis Mother     deceased at young age  . Tuberculosis Father     deceased at young age    History   Social History  . Marital Status: Widowed  Spouse Name: N/A  . Number of Children: N/A  . Years of Education: N/A   Occupational History  . Not on file.   Social History Main Topics  . Smoking status: Former Research scientist (life sciences)  . Smokeless tobacco: Not on file     Comment: Quit smoking x 25 years  . Alcohol Use: Not on file  . Drug Use: Not on file  . Sexual Activity: Not on file   Other Topics Concern  . Not on file   Social History Narrative      ROS:  General: Negative for anorexia, weight loss, fever, chills, fatigue, weakness. Eyes: Negative for vision changes.  ENT: Negative for hoarseness, difficulty swallowing , nasal congestion. CV: Negative for chest pain, angina, palpitations,  dyspnea on exertion, peripheral edema.  Respiratory: Negative for dyspnea at rest, dyspnea on exertion, cough, sputum, wheezing.  GI: See history of present illness. GU:  Negative for dysuria, hematuria, urinary incontinence, urinary frequency, nocturnal urination.  MS: Negative for joint pain, low back pain.  Derm: Negative for rash or itching.  Neuro: Negative for weakness, abnormal sensation, seizure, frequent headaches, memory loss, confusion.  Psych: Negative for anxiety, depression, suicidal ideation, hallucinations.  Endo: Negative for unusual weight change.  Heme: Negative for bruising or bleeding. Allergy: Negative for rash or hives.    Physical Examination:  BP 108/67 mmHg  Pulse 53  Temp(Src) 97.6 F (36.4 C) (Oral)  Ht 5\' 3"  (1.6 m)  Wt 201 lb 3.2 oz (91.264 kg)  BMI 35.65 kg/m2   General: Well-nourished, well-developed in no acute distress.  Head: Normocephalic, atraumatic.   Eyes: Conjunctiva pink, no icterus. Mouth: Oropharyngeal mucosa moist and pink , no lesions erythema or exudate. Neck: Supple without thyromegaly, masses, or lymphadenopathy.  Lungs: Clear to auscultation bilaterally.  Heart: Regular rate and rhythm, no murmurs rubs or gallops.  Abdomen: Bowel sounds are normal, nontender, nondistended, no hepatosplenomegaly or masses, no abdominal bruits or    hernia , no rebound or guarding.   Rectal: not performed Extremities: No lower extremity edema. No clubbing or deformities.  Neuro: Alert and oriented x 4 , grossly normal neurologically.  Skin: Warm and dry, no rash or jaundice.   Psych: Alert and cooperative, normal mood and affect.   Imaging Studies: No results found.

## 2015-04-11 NOTE — Interval H&P Note (Signed)
History and Physical Interval Note:  04/11/2015 12:00 PM  Margretta Sidle  has presented today for surgery, with the diagnosis of constipation, change in bowel habits  The various methods of treatment have been discussed with the patient and family. After consideration of risks, benefits and other options for treatment, the patient has consented to  Procedure(s) with comments: COLONOSCOPY (N/A) - 1130 - moved to 11:15 - pt can't come any earlier due to transportation as a surgical intervention .  The patient's history has been reviewed, patient examined, no change in status, stable for surgery.  I have reviewed the patient's chart and labs.  Questions were answered to the patient's satisfaction.     Lakenya Riendeau  Constipation much better on Linzess. Colonoscopy today per plan.  The risks, benefits, limitations, alternatives and imponderables have been reviewed with the patient. Questions have been answered. All parties are agreeable.

## 2015-04-11 NOTE — Discharge Instructions (Addendum)
Colonoscopy Discharge Instructions  Read the instructions outlined below and refer to this sheet in the next few weeks. These discharge instructions provide you with general information on caring for yourself after you leave the hospital. Your doctor may also give you specific instructions. While your treatment has been planned according to the most current medical practices available, unavoidable complications occasionally occur. If you have any problems or questions after discharge, call Dr. Gala Romney at [phone]. ACTIVITY  You may resume your regular activity, but move at a slower pace for the next 24 hours.   Take frequent rest periods for the next 24 hours.   Walking will help get rid of the air and reduce the bloated feeling in your belly (abdomen).   No driving for 24 hours (because of the medicine (anesthesia) used during the test).    Do not sign any important legal documents or operate any machinery for 24 hours (because of the anesthesia used during the test).  NUTRITION  Drink plenty of fluids.   You may resume your normal diet as instructed by your doctor.   Begin with a light meal and progress to your normal diet. Heavy or fried foods are harder to digest and may make you feel sick to your stomach (nauseated).   Avoid alcoholic beverages for 24 hours or as instructed.  MEDICATIONS  You may resume your normal medications unless your doctor tells you otherwise.  WHAT YOU CAN EXPECT TODAY  Some feelings of bloating in the abdomen.   Passage of more gas than usual.   Spotting of blood in your stool or on the toilet paper.  IF YOU HAD POLYPS REMOVED DURING THE COLONOSCOPY:  No aspirin products for 7 days or as instructed.   No alcohol for 7 days or as instructed.   Eat a soft diet for the next 24 hours.  FINDING OUT THE RESULTS OF YOUR TEST Not all test results are available during your visit. If your test results are not back during the visit, make an appointment  with your caregiver to find out the results. Do not assume everything is normal if you have not heard from your caregiver or the medical facility. It is important for you to follow up on all of your test results.  SEEK IMMEDIATE MEDICAL ATTENTION IF:  You have more than a spotting of blood in your stool.    our belly is swollen (abdominal distention).   You are nauseated or vomiting.   You have a temperature over 101.   You have abdominal pain or discomfort that is severe or gets worse throughout the day.    Polyp and diverticulosis information provided  Continue Linzess daily. Add Benefiber 2 teaspoons twice daily to regimen  Further recommendations to follow pending review of pathology report  Diverticulosis Diverticulosis is the condition that develops when small pouches (diverticula) form in the wall of your colon. Your colon, or large intestine, is where water is absorbed and stool is formed. The pouches form when the inside layer of your colon pushes through weak spots in the outer layers of your colon. CAUSES  No one knows exactly what causes diverticulosis. RISK FACTORS  Being older than 68. Your risk for this condition increases with age. Diverticulosis is rare in people younger than 40 years. By age 73, almost everyone has it.  Eating a low-fiber diet.  Being frequently constipated.  Being overweight.  Not getting enough exercise.  Smoking.  Taking over-the-counter pain medicines, like aspirin and ibuprofen.  SYMPTOMS  Most people with diverticulosis do not have symptoms. DIAGNOSIS  Because diverticulosis often has no symptoms, health care providers often discover the condition during an exam for other colon problems. In many cases, a health care provider will diagnose diverticulosis while using a flexible scope to examine the colon (colonoscopy). TREATMENT  If you have never developed an infection related to diverticulosis, you may not need treatment. If you  have had an infection before, treatment may include:  Eating more fruits, vegetables, and grains.  Taking a fiber supplement.  Taking a live bacteria supplement (probiotic).  Taking medicine to relax your colon. HOME CARE INSTRUCTIONS   Drink at least 6-8 glasses of water each day to prevent constipation.  Try not to strain when you have a bowel movement.  Keep all follow-up appointments. If you have had an infection before:  Increase the fiber in your diet as directed by your health care provider or dietitian.  Take a dietary fiber supplement if your health care provider approves.  Only take medicines as directed by your health care provider. SEEK MEDICAL CARE IF:   You have abdominal pain.  You have bloating.  You have cramps.  You have not gone to the bathroom in 3 days. SEEK IMMEDIATE MEDICAL CARE IF:   Your pain gets worse.  Yourbloating becomes very bad.  You have a fever or chills, and your symptoms suddenly get worse.  You begin vomiting.  You have bowel movements that are bloody or black. MAKE SURE YOU:  Understand these instructions.  Will watch your condition.  Will get help right away if you are not doing well or get worse. Document Released: 08/13/2004 Document Revised: 11/21/2013 Document Reviewed: 10/11/2013 [facility] Patient Information 2015 [place], [place]. This information is not intended to replace advice given to you by your health care provider. Make sure you discuss any questions you have with your health care provider. Colon Polyps Polyps are lumps of extra tissue growing inside the body. Polyps can grow in the large intestine (colon). Most colon polyps are noncancerous (benign). However, some colon polyps can become cancerous over time. Polyps that are larger than a pea may be harmful. To be safe, caregivers remove and test all polyps. CAUSES  Polyps form when mutations in the genes cause your cells to grow and divide even though no more  tissue is needed. RISK FACTORS There are a number of risk factors that can increase your chances of getting colon polyps. They include:  Being older than 50 years.  Family history of colon polyps or colon cancer.  Long-term colon diseases, such as colitis or Crohn disease.  Being overweight.  Smoking.  Being inactive.  Drinking too much alcohol. SYMPTOMS  Most small polyps do not cause symptoms. If symptoms are present, they may include:  Blood in the stool. The stool may look dark red or black.  Constipation or diarrhea that lasts longer than 1 week. DIAGNOSIS People often do not know they have polyps until their caregiver finds them during a regular checkup. Your caregiver can use 4 tests to check for polyps:  Digital rectal exam. The caregiver wears gloves and feels inside the rectum. This test would find polyps only in the rectum.  Barium enema. The caregiver puts a liquid called barium into your rectum before taking X-rays of your colon. Barium makes your colon look white. Polyps are dark, so they are easy to see in the X-ray pictures.  Sigmoidoscopy. A thin, flexible tube (sigmoidoscope) is placed  into your rectum. The sigmoidoscope has a light and tiny camera in it. The caregiver uses the sigmoidoscope to look at the last third of your colon.  Colonoscopy. This test is like sigmoidoscopy, but the caregiver looks at the entire colon. This is the most common method for finding and removing polyps. TREATMENT  Any polyps will be removed during a sigmoidoscopy or colonoscopy. The polyps are then tested for cancer. PREVENTION  To help lower your risk of getting more colon polyps:  Eat plenty of fruits and vegetables. Avoid eating fatty foods.  Do not smoke.  Avoid drinking alcohol.  Exercise every day.  Lose weight if recommended by your caregiver.  Eat plenty of calcium and folate. Foods that are rich in calcium include milk, cheese, and broccoli. Foods that are rich  in folate include chickpeas, kidney beans, and spinach. HOME CARE INSTRUCTIONS Keep all follow-up appointments as directed by your caregiver. You may need periodic exams to check for polyps. SEEK MEDICAL CARE IF: You notice bleeding during a bowel movement. Document Released: 08/12/2004 Document Revised: 02/08/2012 Document Reviewed: 01/26/2012 [facility] Patient Information 2015 [place], [place]. This information is not intended to replace advice given to you by your health care provider. Make sure you discuss any questions you have with your health care provider.

## 2015-04-12 ENCOUNTER — Encounter: Payer: Self-pay | Admitting: Internal Medicine

## 2015-04-12 ENCOUNTER — Encounter (HOSPITAL_COMMUNITY): Payer: Self-pay | Admitting: Internal Medicine

## 2015-04-17 ENCOUNTER — Ambulatory Visit (INDEPENDENT_AMBULATORY_CARE_PROVIDER_SITE_OTHER): Payer: PPO | Admitting: Orthopedic Surgery

## 2015-04-17 ENCOUNTER — Other Ambulatory Visit: Payer: Self-pay | Admitting: *Deleted

## 2015-04-17 ENCOUNTER — Encounter: Payer: Self-pay | Admitting: Orthopedic Surgery

## 2015-04-17 VITALS — BP 122/70 | Ht 63.0 in | Wt 198.0 lb

## 2015-04-17 DIAGNOSIS — M170 Bilateral primary osteoarthritis of knee: Secondary | ICD-10-CM

## 2015-04-17 DIAGNOSIS — R2232 Localized swelling, mass and lump, left upper limb: Secondary | ICD-10-CM

## 2015-04-17 NOTE — Patient Instructions (Addendum)
SURGERY 04/26/15 LEFT RING FINGER CYST EXCISION    Joint Injection Care After Refer to this sheet in the next few days. These instructions provide you with information on caring for yourself after you have had a joint injection. Your caregiver also may give you more specific instructions. Your treatment has been planned according to current medical practices, but problems sometimes occur. Call your caregiver if you have any problems or questions after your procedure. After any type of joint injection, it is not uncommon to experience:  Soreness, swelling, or bruising around the injection site.  Mild numbness, tingling, or weakness around the injection site caused by the numbing medicine used before or with the injection. It also is possible to experience the following effects associated with the specific agent after injection:  Iodine-based contrast agents:  Allergic reaction (itching, hives, widespread redness, and swelling beyond the injection site).  Corticosteroids (These effects are rare.):  Allergic reaction.  Increased blood sugar levels (If you have diabetes and you notice that your blood sugar levels have increased, notify your caregiver).  Increased blood pressure levels.  Mood swings.  Hyaluronic acid in the use of viscosupplementation.  Temporary heat or redness.  Temporary rash and itching.  Increased fluid accumulation in the injected joint. These effects all should resolve within a day after your procedure.  HOME CARE INSTRUCTIONS  Limit yourself to light activity the day of your procedure. Avoid lifting heavy objects, bending, stooping, or twisting.  Take prescription or over-the-counter pain medication as directed by your caregiver.  You may apply ice to your injection site to reduce pain and swelling the day of your procedure. Ice may be applied 03-04 times:  Put ice in a plastic bag.  Place a towel between your skin and the bag.  Leave the ice on for  no longer than 15-20 minutes each time. SEEK IMMEDIATE MEDICAL CARE IF:   Pain and swelling get worse rather than better or extend beyond the injection site.  Numbness does not go away.  Blood or fluid continues to leak from the injection site.  You have chest pain.  You have swelling of your face or tongue.  You have trouble breathing or you become dizzy.  You develop a fever, chills, or severe tenderness at the injection site that last longer than 1 day. MAKE SURE YOU:  Understand these instructions.  Watch your condition.  Get help right away if you are not doing well or if you get worse. Document Released: 07/30/2011 Document Revised: 02/08/2012 Document Reviewed: 07/30/2011 [facility] Patient Information 2015 [place], [place]. This information is not intended to replace advice given to you by your health care provider. Make sure you discuss any questions you have with your health care provider.

## 2015-04-17 NOTE — Progress Notes (Signed)
Patient ID: Anna Mcconnell, female   DOB: [birth year], 81 y.o.   MRN: [id number]  Chief Complaint  Patient presents with  . Knee Pain    requests bilateral knee injections  . Hand Pain    Painful mass left ring finger middle phalanx   Anna Mcconnell is a 81 y.o. female.   HPI The patient actually came in today for bilateral knee injections for bilateral knee pain she's received them before and has gotten good results.  So we went ahead and injected both knees  Procedure note left knee injection verbal consent was obtained to inject left knee joint  Timeout was completed to confirm the site of injection  The medications used were 40 mg of Depo-Medrol and 1% lidocaine 3 cc  Anesthesia was provided by ethyl chloride and the skin was prepped with alcohol.  After cleaning the skin with alcohol a 20-gauge needle was used to inject the left knee joint. There were no complications. A sterile bandage was applied.   Procedure note right knee injection verbal consent was obtained to inject right knee joint  Timeout was completed to confirm the site of injection  The medications used were 40 mg of Depo-Medrol and 1% lidocaine 3 cc  Anesthesia was provided by ethyl chloride and the skin was prepped with alcohol.  After cleaning the skin with alcohol a 20-gauge needle was used to inject the right knee joint. There were no complications. A sterile bandage was applied.  However, she is a new problem with a painful mass over the middle aspect of the left ring finger which is painful when she grabs or holds things. She denies numbness or tingling other than she had from the carpal tunnel and she is better from gabapentin. She is denying any fever or redness or puncture wound in that area. Mass present for several weeks. Pain is sharp stabbing. Review of Systems No real new findings on review of systems she does have some joint discomfort no rashes itching redness unsteady gait nervousness depression  and anxiety fever chills  Past Medical History  Diagnosis Date  . HTN (hypertension)   . Spinal stenosis   . Arthritis     Past Surgical History  Procedure Laterality Date  . Colonoscopy  09/06/2006    GUR:KYHCWC rectum.  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  Sigmoid diverticula.  The remainder of the colonic mucosa normal  . Hysterectomy    . Varicose vein ligation    . Cholecystectomy    . Colonoscopy N/A 09/25/2014    RMR:No rectal mass on digital rectal exam or with colonoscopic visualization. Internal hemorrhoids. Poor prep precluded complete examination, limited to sigmoid colon.  . Fracture surgery Right     ankle  . Colonoscopy N/A 04/11/2015    Procedure: COLONOSCOPY;  Surgeon: Daneil Dolin, MD;  Location: [facility];  Service: Endoscopy;  Laterality: N/A;  1130 - moved to 11:15 - pt can't come any earlier due to transportation    Family History  Problem Relation Age of Onset  . Colon cancer Neg Hx   . Tuberculosis Mother     deceased at young age  . Tuberculosis Father     deceased at young age    Social History History  Substance Use Topics  . Smoking status: Former Research scientist (life sciences)  . Smokeless tobacco: Not on file     Comment: Quit smoking x 25 years  . Alcohol Use: Not on file    Allergies  Allergen Reactions  . Psyllium Other (See Comments)    bloats    Current Outpatient Prescriptions  Medication Sig Dispense Refill  . atenolol (TENORMIN) 50 MG tablet Take 50 mg by mouth daily.     . diclofenac sodium (VOLTAREN) 1 % GEL Apply 2 g topically daily as needed (pain).     Marland Kitchen gabapentin (NEURONTIN) 100 MG capsule Take 1 capsule (100 mg total) by mouth at bedtime. 30 capsule 2  . hydrochlorothiazide (HYDRODIURIL) 25 MG tablet Take 25 mg by mouth daily.     . Linaclotide (LINZESS) 290 MCG CAPS capsule Take 1 capsule (290 mcg total) by mouth daily. On empty stomach 30 capsule 5  . LORazepam (ATIVAN) 1 MG tablet Take 1 mg by mouth daily as  needed for anxiety. Only as needed     Not even every daily    . polyethylene glycol (MIRALAX / GLYCOLAX) packet Take 17 g by mouth daily.    Marland Kitchen pyridOXINE (VITAMIN B-6) 100 MG tablet Take 1 tablet (100 mg total) by mouth 2 (two) times daily. 84 tablet 0  . traMADol (ULTRAM) 50 MG tablet Take 50 mg by mouth daily as needed (pain).     . Turmeric Curcumin 500 MG CAPS Take 1 capsule by mouth daily.    . Vitamin D, Ergocalciferol, (DRISDOL) 50000 UNITS CAPS capsule Take 50,000 Units by mouth every 7 (seven) days.     No current facility-administered medications for this visit.       Physical Exam Blood pressure 122/70, height 5\' 3"  (1.6 m), weight 198 lb (89.812 kg). Physical Exam The patient is well developed well nourished and well groomed. Orientation to person place and time is normal  Mood is pleasant. Ambulatory status she walks pretty good with no cane or walker although she uses a cane at times. The mass over the left ring finger is noted on the volar aspect over the middle phalanx. It is possibly 3-4 mm in diameter by 3-4 mm in width it is very tender to touch it is very superficial it does not affect flexion or extension or the tendons or other strength; stability tests are noncontributory, flexion extensor tendon FDP FDS normal skin otherwise normal without discoloration is no evidence of puncture wound color is good capillary refill is good there is no lymphadenopathy involved at the elbow she has slight decreased sensation in the hand from carpal tunnel there are no pathologic reflexes  Data Reviewed No other data is reviewed  Assessment Encounter Diagnoses  Name Primary?  . Primary osteoarthritis of both knees Yes  . Mass of finger, left     Plan She would like the mass removed as soon as possible she will make arrangements for excision of mass volar aspect left ring finger at the middle phalanx

## 2015-04-19 ENCOUNTER — Telehealth: Payer: Self-pay | Admitting: Orthopedic Surgery

## 2015-04-19 NOTE — Telephone Encounter (Addendum)
Regarding Out-patient surgery scheduled 04/26/15 at [facility] for out-patient procedure left ring finger, planned CPT [phone] - per [facility], no pre-authorization required; her name and date for Reference, 04/19/15,11:07am

## 2015-04-22 NOTE — Patient Instructions (Signed)
Anna Mcconnell  04/22/2015     @PREFPERIOPPHARMACY @   Your procedure is scheduled on 04/26/2015  Report to [facility] at  615  A.M.  Call this number if you have problems the morning of surgery:  [phone]   Remember:  Do not eat food or drink liquids after midnight.  Take these medicines the morning of surgery with A SIP OF WATER  Atenolol, neurontin, ativan, lizness, ultram.   Do not wear jewelry, make-up or nail polish.  Do not wear lotions, powders, or perfumes.   Do not shave 48 hours prior to surgery.  Men may shave face and neck.  Do not bring valuables to the hospital.  [facility] is not responsible for any belongings or valuables.  Contacts, dentures or bridgework may not be worn into surgery.  Leave your suitcase in the car.  After surgery it may be brought to your room.  For patients admitted to the hospital, discharge time will be determined by your treatment team.  Patients discharged the day of surgery will not be allowed to drive home.   Name and phone number of your driver:   family Special instructions:  none  Please read over the following fact sheets that you were given. Pain Booklet, Coughing and Deep Breathing, Anesthesia Post-op Instructions and Care and Recovery After Surgery      Cyst Removal Your caregiver has removed a cyst. A cyst is a sac containing a semi-solid material. Cysts may occur any place on your body. They may remain small for years or gradually get larger. A sebaceous cyst is an enlarged (dilated) sweat gland filled with old sweat (sebum). Unattended, these may become large (the size of a softball) over several years time. These are often removed for improved appearance (cosmetic) reasons or before they become infected to form an abscess. An abscess is an infected cyst. HOME CARE INSTRUCTIONS   Keep your bandage clean and dry. You may change your bandage after 24 hours. If your bandage sticks, use warm water to gently loosen  it. Pat the area dry with a clean towel before putting on another bandage.  If possible, keep the area where the cyst was removed raised to relieve soreness, swelling, and promote healing.  If you have stitches, keep them clean and dry.  You may clean your stitches gently with a cotton swab dipped in warm soapy water.  Do not soak the area where the cyst was removed or go swimming. You may shower.  Do not overuse the area where your cyst was removed.  Return in 7 days or as directed to have your stitches removed.  Take medicines as instructed by your caregiver. SEEK IMMEDIATE MEDICAL CARE IF:   An oral temperature above 102 F (38.9 C) develops, not controlled by medication.  Blood continues to soak through the bandage.  You have increasing pain in the area where your cyst was removed.  You have redness, swelling, pus, a bad smell, soreness (inflammation), or red streaks coming away from the stitches. These are signs of infection. MAKE SURE YOU:   Understand these instructions.  Will watch your condition.  Will get help right away if you are not doing well or get worse. Document Released: 11/13/2000 Document Revised: 02/08/2012 Document Reviewed: 03/08/2008 [facility] Patient Information 2015 [place], [place]. This information is not intended to replace advice given to you by your health care provider. Make sure you discuss any questions you have with your health  care provider. PATIENT INSTRUCTIONS POST-ANESTHESIA  IMMEDIATELY FOLLOWING SURGERY:  Do not drive or operate machinery for the first twenty four hours after surgery.  Do not make any important decisions for twenty four hours after surgery or while taking narcotic pain medications or sedatives.  If you develop intractable nausea and vomiting or a severe headache please notify your doctor immediately.  FOLLOW-UP:  Please make an appointment with your surgeon as instructed. You do not need to follow up with anesthesia unless  specifically instructed to do so.  WOUND CARE INSTRUCTIONS (if applicable):  Keep a dry clean dressing on the anesthesia/puncture wound site if there is drainage.  Once the wound has quit draining you may leave it open to air.  Generally you should leave the bandage intact for twenty four hours unless there is drainage.  If the epidural site drains for more than 36-48 hours please call the anesthesia department.  QUESTIONS?:  Please feel free to call your physician or the hospital operator if you have any questions, and they will be happy to assist you.

## 2015-04-23 ENCOUNTER — Encounter (HOSPITAL_COMMUNITY)
Admission: RE | Admit: 2015-04-23 | Discharge: 2015-04-23 | Disposition: A | Discharge status: Home / Self Care | Payer: PPO | Source: Ambulatory Visit | Attending: Orthopedic Surgery | Admitting: Orthopedic Surgery

## 2015-04-23 ENCOUNTER — Telehealth: Payer: Self-pay | Admitting: Orthopedic Surgery

## 2015-04-23 NOTE — Telephone Encounter (Signed)
FYI.Marland KitchenMarland KitchenPatient went to her Pre Op appointment and between her  and the Pre Op Nurse they have decided that her finger is much better and she came by the office and states that her left ring finger is so much better and she is canceling her surgery on Friday 04/26/15.

## 2015-04-23 NOTE — Telephone Encounter (Signed)
Per separate phone note entered today, 04/23/15 - patient has cancelled the out-patient procedure for this date, 04/26/15. Contacted insurer to notify, although no case had been started, due to no pre-authorization required.

## 2015-04-24 NOTE — Telephone Encounter (Signed)
NOTED AND SURGERY CANCELLED

## 2015-04-26 ENCOUNTER — Ambulatory Visit (HOSPITAL_COMMUNITY): Admission: RE | Admit: 2015-04-26 | Payer: PPO | Source: Ambulatory Visit | Admitting: Orthopedic Surgery

## 2015-04-26 ENCOUNTER — Encounter (HOSPITAL_COMMUNITY): Admission: RE | Payer: Self-pay | Source: Ambulatory Visit

## 2015-04-26 SURGERY — CYST REMOVAL
Anesthesia: Choice | Laterality: Left

## 2015-04-30 ENCOUNTER — Ambulatory Visit: Payer: PPO | Admitting: Orthopedic Surgery

## 2015-05-27 ENCOUNTER — Other Ambulatory Visit: Payer: Self-pay

## 2015-05-30 ENCOUNTER — Ambulatory Visit (INDEPENDENT_AMBULATORY_CARE_PROVIDER_SITE_OTHER): Payer: PPO | Admitting: Orthopedic Surgery

## 2015-05-30 ENCOUNTER — Encounter: Payer: Self-pay | Admitting: Orthopedic Surgery

## 2015-05-30 VITALS — BP 127/69 | Ht 63.0 in | Wt 198.0 lb

## 2015-05-30 DIAGNOSIS — M170 Bilateral primary osteoarthritis of knee: Secondary | ICD-10-CM

## 2015-05-30 NOTE — Progress Notes (Signed)
Patient ID: Anna Mcconnell, female   DOB: [birth year], 81 y.o.   MRN: [id number]  Follow up visit  Chief Complaint  Patient presents with  . Follow-up    follow up bilateral knee pain     BP 127/69 mmHg  Ht 5\' 3"  (1.6 m)  Wt 198 lb (89.812 kg)  BMI 35.08 kg/m2  Encounter Diagnosis  Name Primary?  . Primary osteoarthritis of both knees Yes   She's heading to [place]   Procedure note left knee injection verbal consent was obtained to inject left knee joint  Timeout was completed to confirm the site of injection  The medications used were 40 mg of Depo-Medrol and 1% lidocaine 3 cc  Anesthesia was provided by ethyl chloride and the skin was prepped with alcohol.  After cleaning the skin with alcohol a 20-gauge needle was used to inject the left knee joint. There were no complications. A sterile bandage was applied.   Procedure note right knee injection verbal consent was obtained to inject right knee joint  Timeout was completed to confirm the site of injection  The medications used were 40 mg of Depo-Medrol and 1% lidocaine 3 cc  Anesthesia was provided by ethyl chloride and the skin was prepped with alcohol.  After cleaning the skin with alcohol a 20-gauge needle was used to inject the right knee joint. There were no complications. A sterile bandage was applied.

## 2015-05-30 NOTE — Patient Instructions (Signed)
Joint Injection  Care After  Refer to this sheet in the next few days. These instructions provide you with information on caring for yourself after you have had a joint injection. Your caregiver also may give you more specific instructions. Your treatment has been planned according to current medical practices, but problems sometimes occur. Call your caregiver if you have any problems or questions after your procedure.  After any type of joint injection, it is not uncommon to experience:  · Soreness, swelling, or bruising around the injection site.  · Mild numbness, tingling, or weakness around the injection site caused by the numbing medicine used before or with the injection.  It also is possible to experience the following effects associated with the specific agent after injection:  · Iodine-based contrast agents:  ¨ Allergic reaction (itching, hives, widespread redness, and swelling beyond the injection site).  · Corticosteroids (These effects are rare.):  ¨ Allergic reaction.  ¨ Increased blood sugar levels (If you have diabetes and you notice that your blood sugar levels have increased, notify your caregiver).  ¨ Increased blood pressure levels.  ¨ Mood swings.  · Hyaluronic acid in the use of viscosupplementation.  ¨ Temporary heat or redness.  ¨ Temporary rash and itching.  ¨ Increased fluid accumulation in the injected joint.  These effects all should resolve within a day after your procedure.   HOME CARE INSTRUCTIONS  · Limit yourself to light activity the day of your procedure. Avoid lifting heavy objects, bending, stooping, or twisting.  · Take prescription or over-the-counter pain medication as directed by your caregiver.  · You may apply ice to your injection site to reduce pain and swelling the day of your procedure. Ice may be applied 03-04 times:  ¨ Put ice in a plastic bag.  ¨ Place a towel between your skin and the bag.  ¨ Leave the ice on for no longer than 15-20 minutes each time.  SEEK  IMMEDIATE MEDICAL CARE IF:   · Pain and swelling get worse rather than better or extend beyond the injection site.  · Numbness does not go away.  · Blood or fluid continues to leak from the injection site.  · You have chest pain.  · You have swelling of your face or tongue.  · You have trouble breathing or you become dizzy.  · You develop a fever, chills, or severe tenderness at the injection site that last longer than 1 day.  MAKE SURE YOU:  · Understand these instructions.  · Watch your condition.  · Get help right away if you are not doing well or if you get worse.  Document Released: 07/30/2011 Document Revised: 02/08/2012 Document Reviewed: 07/30/2011  Exit[employer]® Patient Information ©2015 [employer]. This information is not intended to replace advice given to you by your health care provider. Make sure you discuss any questions you have with your health care provider.

## 2015-07-15 ENCOUNTER — Other Ambulatory Visit: Payer: Self-pay | Admitting: *Deleted

## 2015-07-15 ENCOUNTER — Telehealth: Payer: Self-pay | Admitting: Orthopedic Surgery

## 2015-07-15 MED ORDER — GABAPENTIN 100 MG PO CAPS: 100.0000 mg | ORAL_CAPSULE | Freq: Every day | ORAL | Status: DC

## 2015-07-15 NOTE — Telephone Encounter (Signed)
Refilled as requested  

## 2015-07-15 NOTE — Telephone Encounter (Signed)
Patient is calling requesting a medication refill on gabapentin (NEURONTIN) 100 MG capsule  Please advise?

## 2015-07-15 NOTE — Telephone Encounter (Signed)
Okay to refill? 

## 2015-07-15 NOTE — Telephone Encounter (Signed)
REFILL 

## 2015-07-30 ENCOUNTER — Other Ambulatory Visit (HOSPITAL_COMMUNITY): Payer: Self-pay | Admitting: Family Medicine

## 2015-07-30 DIAGNOSIS — Z1231 Encounter for screening mammogram for malignant neoplasm of breast: Secondary | ICD-10-CM

## 2015-08-02 ENCOUNTER — Ambulatory Visit (HOSPITAL_COMMUNITY)
Admission: RE | Admit: 2015-08-02 | Discharge: 2015-08-02 | Disposition: A | Discharge status: Home / Self Care | Payer: PPO | Source: Ambulatory Visit | Attending: Family Medicine | Admitting: Family Medicine

## 2015-08-02 DIAGNOSIS — Z1231 Encounter for screening mammogram for malignant neoplasm of breast: Secondary | ICD-10-CM | POA: Insufficient documentation

## 2015-08-12 ENCOUNTER — Other Ambulatory Visit: Payer: Self-pay | Admitting: Gastroenterology

## 2015-09-03 ENCOUNTER — Ambulatory Visit (INDEPENDENT_AMBULATORY_CARE_PROVIDER_SITE_OTHER): Payer: PPO | Admitting: Orthopedic Surgery

## 2015-09-03 VITALS — BP 122/72 | Ht 63.0 in | Wt 204.0 lb

## 2015-09-03 DIAGNOSIS — M170 Bilateral primary osteoarthritis of knee: Secondary | ICD-10-CM

## 2015-09-03 NOTE — Patient Instructions (Signed)
Joint Injection  Care After  Refer to this sheet in the next few days. These instructions provide you with information on caring for yourself after you have had a joint injection. Your caregiver also may give you more specific instructions. Your treatment has been planned according to current medical practices, but problems sometimes occur. Call your caregiver if you have any problems or questions after your procedure.  After any type of joint injection, it is not uncommon to experience:  · Soreness, swelling, or bruising around the injection site.  · Mild numbness, tingling, or weakness around the injection site caused by the numbing medicine used before or with the injection.  It also is possible to experience the following effects associated with the specific agent after injection:  · Iodine-based contrast agents:  ¨ Allergic reaction (itching, hives, widespread redness, and swelling beyond the injection site).  · Corticosteroids (These effects are rare.):  ¨ Allergic reaction.  ¨ Increased blood sugar levels (If you have diabetes and you notice that your blood sugar levels have increased, notify your caregiver).  ¨ Increased blood pressure levels.  ¨ Mood swings.  · Hyaluronic acid in the use of viscosupplementation.  ¨ Temporary heat or redness.  ¨ Temporary rash and itching.  ¨ Increased fluid accumulation in the injected joint.  These effects all should resolve within a day after your procedure.   HOME CARE INSTRUCTIONS  · Limit yourself to light activity the day of your procedure. Avoid lifting heavy objects, bending, stooping, or twisting.  · Take prescription or over-the-counter pain medication as directed by your caregiver.  · You may apply ice to your injection site to reduce pain and swelling the day of your procedure. Ice may be applied 03-04 times:  ¨ Put ice in a plastic bag.  ¨ Place a towel between your skin and the bag.  ¨ Leave the ice on for no longer than 15-20 minutes each time.  SEEK  IMMEDIATE MEDICAL CARE IF:   · Pain and swelling get worse rather than better or extend beyond the injection site.  · Numbness does not go away.  · Blood or fluid continues to leak from the injection site.  · You have chest pain.  · You have swelling of your face or tongue.  · You have trouble breathing or you become dizzy.  · You develop a fever, chills, or severe tenderness at the injection site that last longer than 1 day.  MAKE SURE YOU:  · Understand these instructions.  · Watch your condition.  · Get help right away if you are not doing well or if you get worse.  Document Released: 07/30/2011 Document Revised: 02/08/2012 Document Reviewed: 07/30/2011  Exit[employer]® Patient Information ©2015 [employer]. This information is not intended to replace advice given to you by your health care provider. Make sure you discuss any questions you have with your health care provider.

## 2015-09-03 NOTE — Progress Notes (Signed)
Patient ID: Anna Mcconnell, female   DOB: 02-15-1931, 81 y.o.   MRN: [id number]  Follow up visit  Chief Complaint  Patient presents with  . Follow-up    follow up bilateral knees, requests repeat injections    BP 122/72 mmHg  Ht 5\' 3"  (1.6 m)  Wt 204 lb (92.534 kg)  BMI 36.15 kg/m2  No diagnosis found.  Procedure note left knee injection verbal consent was obtained to inject left knee joint  Timeout was completed to confirm the site of injection  The medications used were 40 mg of Depo-Medrol and 1% lidocaine 3 cc  Anesthesia was provided by ethyl chloride and the skin was prepped with alcohol.  After cleaning the skin with alcohol a 20-gauge needle was used to inject the left knee joint. There were no complications. A sterile bandage was applied.   Procedure note right knee injection verbal consent was obtained to inject right knee joint  Timeout was completed to confirm the site of injection  The medications used were 40 mg of Depo-Medrol and 1% lidocaine 3 cc  Anesthesia was provided by ethyl chloride and the skin was prepped with alcohol.  After cleaning the skin with alcohol a 20-gauge needle was used to inject the right knee joint. There were no complications. A sterile bandage was applied.

## 2015-09-05 ENCOUNTER — Telehealth: Payer: Self-pay | Admitting: Internal Medicine

## 2015-09-05 ENCOUNTER — Ambulatory Visit: Payer: PPO | Admitting: Orthopedic Surgery

## 2015-09-05 NOTE — Telephone Encounter (Signed)
PATIENT WANTS TO KNOW ABOUT CONTINUING LINZESS    HER TCS CAME BACK OK   IF SO THE PHARMACY NEEDS A REFILL  [phone]

## 2015-09-05 NOTE — Telephone Encounter (Signed)
Per RMR procedure note, pt should continue taking linzess. Pt needs rx sent to the pharmacy.

## 2015-09-06 MED ORDER — LINACLOTIDE 290 MCG PO CAPS: ORAL_CAPSULE | ORAL | Status: DC

## 2015-09-06 NOTE — Addendum Note (Signed)
Addended by: Mahala Menghini on: 09/06/2015 11:37 AM   Modules accepted: Orders

## 2015-09-06 NOTE — Telephone Encounter (Signed)
done

## 2015-10-07 ENCOUNTER — Other Ambulatory Visit: Payer: Self-pay | Admitting: *Deleted

## 2015-10-07 MED ORDER — GABAPENTIN 100 MG PO CAPS: 100.0000 mg | ORAL_CAPSULE | Freq: Every day | ORAL | Status: DC

## 2016-01-07 ENCOUNTER — Ambulatory Visit (INDEPENDENT_AMBULATORY_CARE_PROVIDER_SITE_OTHER): Payer: PPO | Admitting: Orthopedic Surgery

## 2016-01-07 ENCOUNTER — Encounter: Payer: Self-pay | Admitting: Orthopedic Surgery

## 2016-01-07 VITALS — BP 136/57 | Ht 63.0 in | Wt 204.0 lb

## 2016-01-07 DIAGNOSIS — G5602 Carpal tunnel syndrome, left upper limb: Secondary | ICD-10-CM | POA: Diagnosis not present

## 2016-01-07 DIAGNOSIS — M170 Bilateral primary osteoarthritis of knee: Secondary | ICD-10-CM | POA: Diagnosis not present

## 2016-01-07 DIAGNOSIS — M25562 Pain in left knee: Secondary | ICD-10-CM

## 2016-01-07 DIAGNOSIS — M5137 Other intervertebral disc degeneration, lumbosacral region: Secondary | ICD-10-CM | POA: Diagnosis not present

## 2016-01-07 NOTE — Progress Notes (Signed)
Patient ID: Anna Mcconnell, female   DOB: 11-27-1931, 82 y.o.   MRN: [id number]  Chief Complaint  Patient presents with  . Follow-up    FOLLOW UP BILATERAL KNEE PAIN   The patient comes in today for reevaluation of both knees status post bilateral injections back in November or December of last year; however she has several new complaints  #1 she is having lateral joint line pain and catching with symptoms present for the last 2-3 weeks.  #2 she denies back pain incision is hip pain but is actually in her lower back and the symptoms and pain runs down her right leg  She has a history of Encounter Diagnoses  Name Primary?  . Carpal tunnel syndrome, left Yes  . Primary osteoarthritis of both knees   . DDD (degenerative disc disease), lumbosacral     HPI Anna Mcconnell is a 82 y.o. female.  The carpal tunnel symptoms are controlled with gabapentin.  Review of Systems Review of Systems 1. Bowel and bladder function remain intact 2. No weakness is noted in the right leg   Past Medical History  Diagnosis Date  . HTN (hypertension)   . Spinal stenosis   . Arthritis     Past Surgical History  Procedure Laterality Date  . Colonoscopy  09/06/2006    [id number] rectum.  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  Sigmoid diverticula.  The remainder of the colonic mucosa normal  . Hysterectomy    . Varicose vein ligation    . Cholecystectomy    . Colonoscopy N/A 09/25/2014    RMR:No rectal mass on digital rectal exam or with colonoscopic visualization. Internal hemorrhoids. Poor prep precluded complete examination, limited to sigmoid colon.  . Fracture surgery Right     ankle  . Colonoscopy N/A 04/11/2015    Procedure: COLONOSCOPY;  Surgeon: Daneil Dolin, MD;  Location: [facility];  Service: Endoscopy;  Laterality: N/A;  1130 - moved to 11:15 - pt can't come any earlier due to transportation    Social History Social History  Substance Use Topics  . Smoking  status: Former Research scientist (life sciences)  . Smokeless tobacco: None     Comment: Quit smoking x 25 years  . Alcohol Use: None    Allergies  Allergen Reactions  . Psyllium Other (See Comments)    bloats    Current Outpatient Prescriptions  Medication Sig Dispense Refill  . atenolol (TENORMIN) 50 MG tablet Take 50 mg by mouth daily.     . diclofenac sodium (VOLTAREN) 1 % GEL Apply 2 g topically 2 (two) times daily as needed (knee pain).     Marland Kitchen gabapentin (NEURONTIN) 100 MG capsule Take 1 capsule (100 mg total) by mouth at bedtime. 30 capsule 5  . hydrochlorothiazide (HYDRODIURIL) 25 MG tablet Take 25 mg by mouth daily.     . Linaclotide (LINZESS) 290 MCG CAPS capsule TAK ONE CAPSULE BY MOUTH DAILY ON AN EMPTY STOMACH. 30 capsule 5  . LORazepam (ATIVAN) 1 MG tablet Take 1 mg by mouth daily as needed for anxiety. Only as needed     Not even every daily    . polyethylene glycol (MIRALAX / GLYCOLAX) packet Take 17 g by mouth daily.    . traMADol (ULTRAM) 50 MG tablet Take 50 mg by mouth daily as needed (pain).     . Vitamin D, Ergocalciferol, (DRISDOL) 50000 UNITS CAPS capsule Take 50,000 Units by mouth every 7 (seven) days. Fridays     No  current facility-administered medications for this visit.      Physical Exam Physical Exam Blood pressure 136/57, height 5\' 3"  (1.6 m), weight 204 lb (92.534 kg).  Gen. appearance normal grooming and hygiene large body habitus The patient is alert and oriented person place and time Mood is normal affect is normal Ambulatory status normal without assistive devices and no limping is noted  Exam of the lumbar spine area reveals tenderness in the right SI joint right buttock and right lower back. Muscle tone is normal.    Inspection of the right lower extremity reveals tenderness in the iliotibial band this tracks down the iliotibial band back towards the hip, the medial and lateral joint line is actually nontender and there is no effusion ROM knee flexion arc is  120 Stability cruciate ligaments are stable Strength muscle tone normal without atrophy  Skin: Warm dry and intact  Pulses: No peripheral edema  Neuro: Soft touch pressure and position sense normal  On the left side we have medial and lateral joint line tenderness with small effusion, knee flexion arc 120 with stable cruciate ligaments and normal muscle tone, the skin is warm dry and intact there is no peripheral edema and she has normal soft touch pressure and position sense     Data Reviewed No new x-rays were obtained today  Assessment    Impression #1 carpal tunnel stable with gabapentin continue Impression #2 degenerative disc disease with radicular symptoms recommend IM injection right hip Impression #3 exacerbation of degenerative arthritis left knee recommend intra-articular injection    Plan    Follow-up in 3 months  Procedure note left knee injection verbal consent was obtained to inject left knee joint  Timeout was completed to confirm the site of injection  The medications used were 40 mg of Depo-Medrol and 1% lidocaine 3 cc  Anesthesia was provided by ethyl chloride and the skin was prepped with alcohol.  After cleaning the skin with alcohol a 20-gauge needle was used to inject the left knee joint. There were no complications. A sterile bandage was applied.   A steroid injection was performed at right IM hip using 1% plain Lidocaine and 40 mg of Depo-Medrol This was well tolerated.       Arther Abbott 01/07/2016, 2:52 PM

## 2016-01-07 NOTE — Patient Instructions (Signed)
Joint Injection  Care After  Refer to this sheet in the next few days. These instructions provide you with information on caring for yourself after you have had a joint injection. Your caregiver also may give you more specific instructions. Your treatment has been planned according to current medical practices, but problems sometimes occur. Call your caregiver if you have any problems or questions after your procedure.  After any type of joint injection, it is not uncommon to experience:  · Soreness, swelling, or bruising around the injection site.  · Mild numbness, tingling, or weakness around the injection site caused by the numbing medicine used before or with the injection.  It also is possible to experience the following effects associated with the specific agent after injection:  · Iodine-based contrast agents:  ¨ Allergic reaction (itching, hives, widespread redness, and swelling beyond the injection site).  · Corticosteroids (These effects are rare.):  ¨ Allergic reaction.  ¨ Increased blood sugar levels (If you have diabetes and you notice that your blood sugar levels have increased, notify your caregiver).  ¨ Increased blood pressure levels.  ¨ Mood swings.  · Hyaluronic acid in the use of viscosupplementation.  ¨ Temporary heat or redness.  ¨ Temporary rash and itching.  ¨ Increased fluid accumulation in the injected joint.  These effects all should resolve within a day after your procedure.   HOME CARE INSTRUCTIONS  · Limit yourself to light activity the day of your procedure. Avoid lifting heavy objects, bending, stooping, or twisting.  · Take prescription or over-the-counter pain medication as directed by your caregiver.  · You may apply ice to your injection site to reduce pain and swelling the day of your procedure. Ice may be applied 03-04 times:  ¨ Put ice in a plastic bag.  ¨ Place a towel between your skin and the bag.  ¨ Leave the ice on for no longer than 15-20 minutes each time.  SEEK  IMMEDIATE MEDICAL CARE IF:   · Pain and swelling get worse rather than better or extend beyond the injection site.  · Numbness does not go away.  · Blood or fluid continues to leak from the injection site.  · You have chest pain.  · You have swelling of your face or tongue.  · You have trouble breathing or you become dizzy.  · You develop a fever, chills, or severe tenderness at the injection site that last longer than 1 day.  MAKE SURE YOU:  · Understand these instructions.  · Watch your condition.  · Get help right away if you are not doing well or if you get worse.  Document Released: 07/30/2011 Document Revised: 02/08/2012 Document Reviewed: 07/30/2011  Exit[employer]® Patient Information ©2015 [employer]. This information is not intended to replace advice given to you by your health care provider. Make sure you discuss any questions you have with your health care provider.

## 2016-01-23 DIAGNOSIS — H538 Other visual disturbances: Secondary | ICD-10-CM | POA: Diagnosis not present

## 2016-02-07 ENCOUNTER — Encounter: Payer: Self-pay | Admitting: Gastroenterology

## 2016-02-07 ENCOUNTER — Ambulatory Visit (INDEPENDENT_AMBULATORY_CARE_PROVIDER_SITE_OTHER): Payer: PPO | Admitting: Gastroenterology

## 2016-02-07 VITALS — BP 119/74 | HR 56 | Temp 97.8°F | Ht 63.0 in | Wt 202.8 lb

## 2016-02-07 DIAGNOSIS — K5900 Constipation, unspecified: Secondary | ICD-10-CM

## 2016-02-07 DIAGNOSIS — B372 Candidiasis of skin and nail: Secondary | ICD-10-CM | POA: Diagnosis not present

## 2016-02-07 MED ORDER — KETOCONAZOLE 2 % EX CREA: 1.0000 "application " | TOPICAL_CREAM | Freq: Two times a day (BID) | CUTANEOUS | Status: AC

## 2016-02-07 NOTE — Progress Notes (Signed)
Primary Care Physician: Robert Bellow, MD  Primary Gastroenterologist:  Garfield Cornea, MD   Chief Complaint  Patient presents with  . Constipation    HPI: Anna Mcconnell is a 82 y.o. female here for further evaluation of a skin rash. She has a history of constipation which is well managed on daily Linzess 252mcg daily, MIralax every evening as needed. She wonders if she can cut back on any of the medication. She denies abdominal pain, melena, rectal bleeding, heartburn, weight loss. She completed a colonoscopy in May 2016 and had colonic diverticulosis, nonbleeding AVM, single tubular adenoma removed.  She is concerned about a rash that she has underneath her panniculus bilaterally. Her system for several weeks, has been using betamethasone which she has had at home. Seems to be helping calm the rash down but has not cleared it up. It is very itchy. It is not painful. She's never had this before in this location but has had similar rash underneath her breast bilaterally in the summer times. She does not use hot tub. [facility] She wears [facility].  Current Outpatient Prescriptions  Medication Sig Dispense Refill  . atenolol (TENORMIN) 50 MG tablet Take 50 mg by mouth daily.     . diclofenac sodium (VOLTAREN) 1 % GEL Apply 2 g topically 2 (two) times daily as needed (knee pain).     . hydrochlorothiazide (HYDRODIURIL) 25 MG tablet Take 25 mg by mouth daily.     . Linaclotide (LINZESS) 290 MCG CAPS capsule TAK ONE CAPSULE BY MOUTH DAILY ON AN EMPTY STOMACH. 30 capsule 5  . LORazepam (ATIVAN) 1 MG tablet Take 1 mg by mouth daily as needed for anxiety. Only as needed     Not even every daily    . polyethylene glycol (MIRALAX / GLYCOLAX) packet Take 17 g by mouth daily.    . traMADol (ULTRAM) 50 MG tablet Take 50 mg by mouth daily as needed (pain).     . Vitamin D, Ergocalciferol, (DRISDOL) 50000 UNITS CAPS capsule Take 50,000 Units by mouth every 7  (seven) days. Fridays    . gabapentin (NEURONTIN) 100 MG capsule Take 1 capsule (100 mg total) by mouth at bedtime. (Patient not taking: Reported on 02/07/2016) 30 capsule 5   No current facility-administered medications for this visit.    Allergies as of 02/07/2016 - Review Complete 02/07/2016  Allergen Reaction Noted  . Psyllium Other (See Comments) 09/11/2014    ROS:  General: Negative for anorexia, weight loss, fever, chills, fatigue, weakness. ENT: Negative for hoarseness, difficulty swallowing , nasal congestion. CV: Negative for chest pain, angina, palpitations, dyspnea on exertion, peripheral edema.  Respiratory: Negative for dyspnea at rest, dyspnea on exertion, cough, sputum, wheezing.  GI: See history of present illness. GU:  Negative for dysuria, hematuria, urinary incontinence, urinary frequency, nocturnal urination.  Endo: Negative for unusual weight change.    Physical Examination:   BP 119/74 mmHg  Pulse 56  Temp(Src) 97.8 F (36.6 C) (Oral)  Ht 5\' 3"  (1.6 m)  Wt 202 lb 12.8 oz (91.989 kg)  BMI 35.93 kg/m2  General: Well-nourished, well-developed in no acute distress.  Eyes: No icterus. Mouth: Oropharyngeal mucosa moist and pink , no lesions erythema or exudate. Lungs: Clear to auscultation bilaterally.  Heart: Regular rate and rhythm, no murmurs rubs or gallops.  Abdomen: Bowel sounds are normal, nontender, nondistended, no hepatosplenomegaly or masses, no abdominal bruits or hernia , no rebound or  guarding.   Extremities: No lower extremity edema. No clubbing or deformities. Neuro: Alert and oriented x 4   Skin: Warm and dry, no jaundice.  Intertriginous rash with Roma Kayser more on the right side, small area on the left. Well demarcated, no vesicles, mild to moderate erythema. Findings most consistent with cutaneous Candida, partially treated. Psych: Alert and cooperative, normal mood and affect.

## 2016-02-07 NOTE — Patient Instructions (Signed)
1. For your skin rash, start ketoconazole cream, apply to the affected area twice a day. Continue using medication for at least 2 days after rash has completely resolved.  Please keep skin dry, consider changing to cotton underwear.Call if rash is persistent. 2. You may decrease Linzess to every other day. As long as her bowel movements remain regular, you can continue tapering until you are off the medication. I will continue MiraLAX at bedtime as needed for now. 3. Return to the office as needed.

## 2016-02-07 NOTE — Assessment & Plan Note (Signed)
Doing well on current regimen. She would like to decrease her Linzess if possible. Recommend decreasing every other day to see how she does with eventually try coming off the medication. Would continue MiraLAX at bedtime as needed. Call with any questions or concerns.

## 2016-02-07 NOTE — Assessment & Plan Note (Signed)
Stop betamethasone. Start ketoconazole 2% cream twice a day to the affected area, continue until at least 48 hours after rash resolved. Call with persistent rash. Important to keep the area clean, dry. Consider switching to cotton underwear to decrease moisture. Patient will return to the office as needed.

## 2016-02-10 ENCOUNTER — Ambulatory Visit: Payer: PPO | Admitting: Gastroenterology

## 2016-02-10 NOTE — Progress Notes (Signed)
CC'ED TO PCP 

## 2016-03-10 DIAGNOSIS — M170 Bilateral primary osteoarthritis of knee: Secondary | ICD-10-CM | POA: Diagnosis not present

## 2016-03-10 DIAGNOSIS — M810 Age-related osteoporosis without current pathological fracture: Secondary | ICD-10-CM | POA: Diagnosis not present

## 2016-03-10 DIAGNOSIS — E782 Mixed hyperlipidemia: Secondary | ICD-10-CM | POA: Diagnosis not present

## 2016-03-10 DIAGNOSIS — I10 Essential (primary) hypertension: Secondary | ICD-10-CM | POA: Diagnosis not present

## 2016-03-16 ENCOUNTER — Telehealth: Payer: Self-pay | Admitting: Internal Medicine

## 2016-03-16 ENCOUNTER — Other Ambulatory Visit: Payer: Self-pay | Admitting: Gastroenterology

## 2016-03-16 NOTE — Telephone Encounter (Signed)
rx done

## 2016-03-16 NOTE — Telephone Encounter (Signed)
Patient called stating that her linzess still was not at the pharmacy

## 2016-03-16 NOTE — Telephone Encounter (Signed)
It is in the refill box, looks like it just came through today.

## 2016-04-07 ENCOUNTER — Encounter: Payer: Self-pay | Admitting: Orthopedic Surgery

## 2016-04-07 ENCOUNTER — Ambulatory Visit (INDEPENDENT_AMBULATORY_CARE_PROVIDER_SITE_OTHER): Payer: PPO | Admitting: Orthopedic Surgery

## 2016-04-07 VITALS — Ht 63.0 in

## 2016-04-07 DIAGNOSIS — M25561 Pain in right knee: Secondary | ICD-10-CM | POA: Diagnosis not present

## 2016-04-07 DIAGNOSIS — M25562 Pain in left knee: Secondary | ICD-10-CM | POA: Diagnosis not present

## 2016-04-07 DIAGNOSIS — M1712 Unilateral primary osteoarthritis, left knee: Secondary | ICD-10-CM

## 2016-04-07 DIAGNOSIS — M129 Arthropathy, unspecified: Secondary | ICD-10-CM

## 2016-04-07 DIAGNOSIS — M1711 Unilateral primary osteoarthritis, right knee: Secondary | ICD-10-CM

## 2016-04-07 NOTE — Patient Instructions (Signed)
Return as needed   Referral to Dr Caprice Beaver

## 2016-04-07 NOTE — Progress Notes (Signed)
Follow-up request bilateral knee injections complains of bilateral knee pain heading to [place] for the summer   Procedure note left knee injection verbal consent was obtained to inject left knee joint  Timeout was completed to confirm the site of injection  The medications used were 40 mg of Depo-Medrol and 1% lidocaine 3 cc  Anesthesia was provided by ethyl chloride and the skin was prepped with alcohol.  After cleaning the skin with alcohol a 20-gauge needle was used to inject the left knee joint. There were no complications. A sterile bandage was applied.   Procedure note right knee injection verbal consent was obtained to inject right knee joint  Timeout was completed to confirm the site of injection  The medications used were 40 mg of Depo-Medrol and 1% lidocaine 3 cc  Anesthesia was provided by ethyl chloride and the skin was prepped with alcohol.  After cleaning the skin with alcohol a 20-gauge needle was used to inject the right knee joint. There were no complications. A sterile bandage was applied.

## 2016-04-14 ENCOUNTER — Telehealth: Payer: Self-pay | Admitting: *Deleted

## 2016-04-14 NOTE — Telephone Encounter (Signed)
PATIENT DECLINES PODIATRY REFERRAL AT THIS TIME, WILL CALL BACK

## 2016-06-04 ENCOUNTER — Ambulatory Visit (INDEPENDENT_AMBULATORY_CARE_PROVIDER_SITE_OTHER): Payer: PPO | Admitting: Orthopedic Surgery

## 2016-06-04 ENCOUNTER — Encounter: Payer: Self-pay | Admitting: Orthopedic Surgery

## 2016-06-04 VITALS — BP 120/73 | HR 62 | Temp 97.7°F | Ht 64.0 in | Wt 204.1 lb

## 2016-06-04 DIAGNOSIS — M25561 Pain in right knee: Secondary | ICD-10-CM | POA: Diagnosis not present

## 2016-06-04 DIAGNOSIS — M25562 Pain in left knee: Secondary | ICD-10-CM

## 2016-06-04 DIAGNOSIS — M1712 Unilateral primary osteoarthritis, left knee: Secondary | ICD-10-CM

## 2016-06-04 DIAGNOSIS — M129 Arthropathy, unspecified: Secondary | ICD-10-CM

## 2016-06-04 DIAGNOSIS — M1711 Unilateral primary osteoarthritis, right knee: Secondary | ICD-10-CM

## 2016-06-04 NOTE — Progress Notes (Signed)
Chief Complaint  Patient presents with  . Injections   Follow-up injections both knee requested     Procedure note left knee injection verbal consent was obtained to inject left knee joint  Timeout was completed to confirm the site of injection  The medications used were 40 mg of Depo-Medrol and 1% lidocaine 3 cc  Anesthesia was provided by ethyl chloride and the skin was prepped with alcohol.  After cleaning the skin with alcohol a 20-gauge needle was used to inject the left knee joint. There were no complications. A sterile bandage was applied.   Procedure note right knee injection verbal consent was obtained to inject right knee joint  Timeout was completed to confirm the site of injection  The medications used were 40 mg of Depo-Medrol and 1% lidocaine 3 cc  Anesthesia was provided by ethyl chloride and the skin was prepped with alcohol.  After cleaning the skin with alcohol a 20-gauge needle was used to inject the right knee joint. There were no complications. A sterile bandage was applied.

## 2016-06-04 NOTE — Patient Instructions (Signed)

## 2016-06-26 DIAGNOSIS — D485 Neoplasm of uncertain behavior of skin: Secondary | ICD-10-CM | POA: Diagnosis not present

## 2016-06-26 DIAGNOSIS — D229 Melanocytic nevi, unspecified: Secondary | ICD-10-CM | POA: Diagnosis not present

## 2016-06-26 DIAGNOSIS — C44311 Basal cell carcinoma of skin of nose: Secondary | ICD-10-CM | POA: Diagnosis not present

## 2016-07-08 DIAGNOSIS — C44311 Basal cell carcinoma of skin of nose: Secondary | ICD-10-CM | POA: Diagnosis not present

## 2016-07-13 ENCOUNTER — Ambulatory Visit: Payer: PPO | Admitting: Orthopedic Surgery

## 2016-07-20 ENCOUNTER — Other Ambulatory Visit (HOSPITAL_COMMUNITY): Payer: Self-pay | Admitting: Family Medicine

## 2016-07-20 DIAGNOSIS — Z1231 Encounter for screening mammogram for malignant neoplasm of breast: Secondary | ICD-10-CM

## 2016-07-27 ENCOUNTER — Ambulatory Visit (INDEPENDENT_AMBULATORY_CARE_PROVIDER_SITE_OTHER): Payer: PPO

## 2016-07-27 ENCOUNTER — Encounter: Payer: Self-pay | Admitting: Orthopedic Surgery

## 2016-07-27 ENCOUNTER — Ambulatory Visit (INDEPENDENT_AMBULATORY_CARE_PROVIDER_SITE_OTHER): Payer: PPO | Admitting: Orthopedic Surgery

## 2016-07-27 VITALS — BP 122/72 | HR 57 | Ht 64.0 in | Wt 205.0 lb

## 2016-07-27 DIAGNOSIS — M1811 Unilateral primary osteoarthritis of first carpometacarpal joint, right hand: Secondary | ICD-10-CM | POA: Diagnosis not present

## 2016-07-27 DIAGNOSIS — M75101 Unspecified rotator cuff tear or rupture of right shoulder, not specified as traumatic: Secondary | ICD-10-CM | POA: Diagnosis not present

## 2016-07-27 DIAGNOSIS — M79644 Pain in right finger(s): Secondary | ICD-10-CM

## 2016-07-27 DIAGNOSIS — M47812 Spondylosis without myelopathy or radiculopathy, cervical region: Secondary | ICD-10-CM

## 2016-07-27 MED ORDER — DICLOFENAC POTASSIUM 50 MG PO TABS
50.0000 mg | ORAL_TABLET | Freq: Two times a day (BID) | ORAL | 3 refills | Status: DC
Start: 2016-07-27 — End: 2016-10-13

## 2016-07-27 NOTE — Patient Instructions (Addendum)
Encounter Diagnoses  Name Primary?  . Pain of right thumb Yes  . Primary osteoarthritis of first carpometacarpal joint of right hand   . Cervical spondylosis without myelopathy   . Rotator cuff tear, right    Start diclofenac 50 mg twice a day   Take medication for 6 weeks CALL THE  office if [place]  refills

## 2016-07-27 NOTE — Progress Notes (Signed)
    Multiple views right thumb  Complains of right thumb pain  Osteoarthritis at the DIP joint also at the [facility] joint  Arthritis is categorized as mild on the DIP joint and moderate on the [facility] joint

## 2016-07-27 NOTE — Progress Notes (Signed)
Patient ID: Anna Mcconnell, female   DOB: 18-Apr-1931, 82 y.o.   MRN: [id number]  Chief Complaint  Patient presents with  . Hand Problem    RIGHT THUMB PAIN AND SWELLING    HPI Anna Mcconnell is a 82 y.o. female.  Presents for evaluation of pain in her right thumb  She has pain in the right thumb at the DIP joint MP joints CMC joint as well as pain when she reaches overhead with weakness in the right shoulder pain in the cervical spine and pain running down the right arm. The pain is been present now for several weeks and not improving. She denies any trauma. She describes a dull aching sensation.  Review of Systems Review of Systems 1. Skin lesion associated with skin cancer recently removed 2. Denies numbness or tingling in the right arm   Past Medical History:  Diagnosis Date  . Arthritis   . HTN (hypertension)   . Spinal stenosis     Past Surgical History:  Procedure Laterality Date  . CHOLECYSTECTOMY    . COLONOSCOPY  09/06/2006   [id number] rectum.  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  Sigmoid diverticula.  The remainder of the colonic mucosa normal  . COLONOSCOPY N/A 09/25/2014   RMR:No rectal mass on digital rectal exam or with colonoscopic visualization. Internal hemorrhoids. Poor prep precluded complete examination, limited to sigmoid colon.  . COLONOSCOPY N/A 04/11/2015   [id number] diverticulosis  . FRACTURE SURGERY Right    ankle  . hysterectomy    . varicose vein ligation      Social History Social History  Substance Use Topics  . Smoking status: Former Research scientist (life sciences)  . Smokeless tobacco: Not on file     Comment: Quit smoking x 25 years  . Alcohol use Not on file    Allergies  Allergen Reactions  . Psyllium Other (See Comments)    bloats    Current Outpatient Prescriptions  Medication Sig Dispense Refill  . atenolol (TENORMIN) 50 MG tablet Take 50 mg by mouth daily.     . diclofenac sodium (VOLTAREN) 1 % GEL Apply 2 g topically 2  (two) times daily as needed (knee pain).     . hydrochlorothiazide (HYDRODIURIL) 25 MG tablet Take 25 mg by mouth daily.     Marland Kitchen ketoconazole (NIZORAL) 2 % cream Apply 1 application topically 2 (two) times daily. Continue for at least 48 hours after rash resolves. 60 g 2  . LINZESS 290 MCG CAPS capsule TAKE ONE CAPSULE BY MOUTH DAILY ON AN EMPTY STOMACH. 90 capsule 3  . LORazepam (ATIVAN) 1 MG tablet Take 1 mg by mouth daily as needed for anxiety. Only as needed     Not even every daily    . traMADol (ULTRAM) 50 MG tablet Take 50 mg by mouth daily as needed (pain).     . Vitamin D, Ergocalciferol, (DRISDOL) 50000 UNITS CAPS capsule Take 50,000 Units by mouth every 7 (seven) days. Fridays    . diclofenac (CATAFLAM) 50 MG tablet Take 1 tablet (50 mg total) by mouth 2 (two) times daily. 90 tablet 3   No current facility-administered medications for this visit.       Physical Exam Physical Exam BP 122/72   Pulse (!) 57   Ht 5\' 4"  (1.626 m)   Wt 205 lb (93 kg)   BMI 35.19 kg/m   Gen. appearance Normal grooming and hygiene The patient is alert and oriented person place and time Mood is  normal affect is normal Ambulatory status no assistive devices needed no limping noted  Exam of the right upper extremity  Inspection tenderness over the right rotator cuff anterior deltoid with weakness to empty can sign painful range of motion with flexion of 120 and no instability  Tenderness at the base of the cervical spine decreased range of motion in rotation  Right thumb deformity of the DIP joint with tenderness of the DIP joint as well as the [facility] joint with crepitance in the [facility] joint  Skin: Right arm normal  Pulses: 2+ radial pulses normal color  Neuro: Normal sensation right upper extremity  Normal strength and range of motion left shoulder   Data Reviewed X-ray of the thumb show CMC joint arthritis and DIP joint arthritis with mild deformity of the DIP joint  Assessment     Encounter Diagnoses  Name Primary?  . Pain of right thumb Yes  . Primary osteoarthritis of first carpometacarpal joint of right hand   . Cervical spondylosis without myelopathy   . Rotator cuff tear, right        Plan    Arthritis medicine at this time follow-up as needed       Arther Abbott 07/27/2016, 10:22 AM

## 2016-08-13 ENCOUNTER — Ambulatory Visit (HOSPITAL_COMMUNITY): Payer: PPO

## 2016-08-13 ENCOUNTER — Ambulatory Visit: Payer: PPO

## 2016-08-17 ENCOUNTER — Ambulatory Visit (HOSPITAL_COMMUNITY)
Admission: RE | Admit: 2016-08-17 | Discharge: 2016-08-17 | Disposition: A | Discharge status: Home / Self Care | Payer: PPO | Source: Ambulatory Visit | Attending: Family Medicine | Admitting: Family Medicine

## 2016-08-17 DIAGNOSIS — Z1231 Encounter for screening mammogram for malignant neoplasm of breast: Secondary | ICD-10-CM | POA: Insufficient documentation

## 2016-09-15 DIAGNOSIS — M170 Bilateral primary osteoarthritis of knee: Secondary | ICD-10-CM | POA: Diagnosis not present

## 2016-09-15 DIAGNOSIS — I10 Essential (primary) hypertension: Secondary | ICD-10-CM | POA: Diagnosis not present

## 2016-09-15 DIAGNOSIS — E6609 Other obesity due to excess calories: Secondary | ICD-10-CM | POA: Diagnosis not present

## 2016-09-15 DIAGNOSIS — Z008 Encounter for other general examination: Secondary | ICD-10-CM | POA: Diagnosis not present

## 2016-09-15 DIAGNOSIS — E782 Mixed hyperlipidemia: Secondary | ICD-10-CM | POA: Diagnosis not present

## 2016-09-16 ENCOUNTER — Telehealth: Payer: Self-pay | Admitting: Internal Medicine

## 2016-09-16 DIAGNOSIS — I10 Essential (primary) hypertension: Secondary | ICD-10-CM | POA: Diagnosis not present

## 2016-09-16 DIAGNOSIS — E782 Mixed hyperlipidemia: Secondary | ICD-10-CM | POA: Diagnosis not present

## 2016-09-16 NOTE — Telephone Encounter (Signed)
Pt was last seen 03/19/15. Is it ok to give her samples?

## 2016-09-16 NOTE — Telephone Encounter (Signed)
#  4 boxes are at the front desk. Pt is aware.

## 2016-09-16 NOTE — Telephone Encounter (Signed)
I actually saw her in March 2017. You may give samples.

## 2016-09-16 NOTE — Telephone Encounter (Signed)
Pt is in the donut hole and needs to get samples of Linzess. She has been without for 2 days. Please call her at [phone] and if she needs an OV please advise.

## 2016-10-13 ENCOUNTER — Encounter: Payer: Self-pay | Admitting: Orthopedic Surgery

## 2016-10-13 ENCOUNTER — Ambulatory Visit (INDEPENDENT_AMBULATORY_CARE_PROVIDER_SITE_OTHER): Payer: PPO | Admitting: Orthopedic Surgery

## 2016-10-13 DIAGNOSIS — M21612 Bunion of left foot: Secondary | ICD-10-CM

## 2016-10-13 DIAGNOSIS — M2042 Other hammer toe(s) (acquired), left foot: Secondary | ICD-10-CM

## 2016-10-13 DIAGNOSIS — M25561 Pain in right knee: Secondary | ICD-10-CM

## 2016-10-13 DIAGNOSIS — M1711 Unilateral primary osteoarthritis, right knee: Secondary | ICD-10-CM

## 2016-10-13 DIAGNOSIS — M25562 Pain in left knee: Secondary | ICD-10-CM

## 2016-10-13 DIAGNOSIS — G8929 Other chronic pain: Secondary | ICD-10-CM

## 2016-10-13 DIAGNOSIS — M1712 Unilateral primary osteoarthritis, left knee: Secondary | ICD-10-CM

## 2016-10-13 NOTE — Progress Notes (Signed)
Patient ID: Anna Mcconnell, female   DOB: 1931-01-01, 82 y.o.   MRN: [id number]  Chief Complaint  Patient presents with  . Follow-up    right knee pain, requests injection    HPI Anna Mcconnell is a 82 y.o. female.  Request bilateral injections  Review of Systems Review of Systems 1. Deformity of the left great toe and second digit    Past Medical History:  Diagnosis Date  . Arthritis   . HTN (hypertension)   . Spinal stenosis     Past Surgical History:  Procedure Laterality Date  . CHOLECYSTECTOMY    . COLONOSCOPY  09/06/2006   [id number] rectum.  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  Sigmoid diverticula.  The remainder of the colonic mucosa normal  . COLONOSCOPY N/A 09/25/2014   RMR:No rectal mass on digital rectal exam or with colonoscopic visualization. Internal hemorrhoids. Poor prep precluded complete examination, limited to sigmoid colon.  . COLONOSCOPY N/A 04/11/2015   [id number] diverticulosis  . FRACTURE SURGERY Right    ankle  . hysterectomy    . varicose vein ligation      Social History Social History  Substance Use Topics  . Smoking status: Former Research scientist (life sciences)  . Smokeless tobacco: Not on file     Comment: Quit smoking x 25 years  . Alcohol use Not on file    Allergies  Allergen Reactions  . Psyllium Other (See Comments)    bloats    Current Meds  Medication Sig  . atenolol (TENORMIN) 50 MG tablet Take 50 mg by mouth daily.   . hydrochlorothiazide (HYDRODIURIL) 25 MG tablet Take 25 mg by mouth daily.   Marland Kitchen ketoconazole (NIZORAL) 2 % cream Apply 1 application topically 2 (two) times daily. Continue for at least 48 hours after rash resolves.  Marland Kitchen LINZESS 290 MCG CAPS capsule TAKE ONE CAPSULE BY MOUTH DAILY ON AN EMPTY STOMACH.  . LORazepam (ATIVAN) 1 MG tablet Take 1 mg by mouth daily as needed for anxiety. Only as needed     Not even every daily  . traMADol (ULTRAM) 50 MG tablet Take 50 mg by mouth daily as needed (pain).   .  Vitamin D, Ergocalciferol, (DRISDOL) 50000 UNITS CAPS capsule Take 50,000 Units by mouth every 7 (seven) days. Fridays  . [DISCONTINUED] diclofenac (CATAFLAM) 50 MG tablet Take 1 tablet (50 mg total) by mouth 2 (two) times daily.  . [DISCONTINUED] diclofenac sodium (VOLTAREN) 1 % GEL Apply 2 g topically 2 (two) times daily as needed (knee pain).       Physical Exam Physical Exam There were no vitals taken for this visit.  Gen. appearance. The patient is well-developed and well-nourished, grooming and hygiene are normal. There are no gross congenital abnormalities  The patient is alert and oriented to person place and time  Mood and affect are normal  She has a severe bunion deformity of the left great toe with pronation and crossover deformity of the second toe with hyperextension at the medical tarsal phalangeal joint tenderness on the plantar aspect of the joint   Data Reviewed   Assessment    Encounter Diagnoses  Name Primary?  . Bunion of great toe of left foot Yes  . Hammertoe of left foot   . Chronic pain of right knee   . Arthritis of knee, left   . Chronic pain of left knee   . Arthritis of knee, right        Plan  Procedure note left knee injection verbal consent was obtained to inject left knee joint  Timeout was completed to confirm the site of injection  The medications used were 40 mg of Depo-Medrol and 1% lidocaine 3 cc  Anesthesia was provided by ethyl chloride and the skin was prepped with alcohol.  After cleaning the skin with alcohol a 20-gauge needle was used to inject the left knee joint. There were no complications. A sterile bandage was applied.   Procedure note right knee injection verbal consent was obtained to inject right knee joint  Timeout was completed to confirm the site of injection  The medications used were 40 mg of Depo-Medrol and 1% lidocaine 3 cc  Anesthesia was provided by ethyl chloride and the skin was prepped with  alcohol.  After cleaning the skin with alcohol a 20-gauge needle was used to inject the right knee joint. There were no complications. A sterile bandage was applied.   As far as the bunion goes it's a fixed deformity she has severe pronation of the great toe hyperextension of the metatarsophalangeal joint of the second toe with tender plantar aspect second MTP joint  Will require referral for corrective surgery        Arther Abbott 10/13/2016, 4:30 PM

## 2016-11-02 DIAGNOSIS — R944 Abnormal results of kidney function studies: Secondary | ICD-10-CM | POA: Diagnosis not present

## 2016-11-04 DIAGNOSIS — M21612 Bunion of left foot: Secondary | ICD-10-CM | POA: Diagnosis not present

## 2016-11-04 DIAGNOSIS — M170 Bilateral primary osteoarthritis of knee: Secondary | ICD-10-CM | POA: Diagnosis not present

## 2016-11-04 DIAGNOSIS — N183 Chronic kidney disease, stage 3 (moderate): Secondary | ICD-10-CM | POA: Diagnosis not present

## 2016-11-04 DIAGNOSIS — M21611 Bunion of right foot: Secondary | ICD-10-CM | POA: Diagnosis not present

## 2016-12-24 DIAGNOSIS — Z08 Encounter for follow-up examination after completed treatment for malignant neoplasm: Secondary | ICD-10-CM | POA: Diagnosis not present

## 2016-12-24 DIAGNOSIS — L570 Actinic keratosis: Secondary | ICD-10-CM | POA: Diagnosis not present

## 2016-12-24 DIAGNOSIS — L820 Inflamed seborrheic keratosis: Secondary | ICD-10-CM | POA: Diagnosis not present

## 2016-12-24 DIAGNOSIS — Z85828 Personal history of other malignant neoplasm of skin: Secondary | ICD-10-CM | POA: Diagnosis not present

## 2016-12-24 DIAGNOSIS — X32XXXA Exposure to sunlight, initial encounter: Secondary | ICD-10-CM | POA: Diagnosis not present

## 2016-12-28 DIAGNOSIS — M21619 Bunion of unspecified foot: Secondary | ICD-10-CM | POA: Diagnosis not present

## 2016-12-28 DIAGNOSIS — M79673 Pain in unspecified foot: Secondary | ICD-10-CM | POA: Diagnosis not present

## 2016-12-28 DIAGNOSIS — M2041 Other hammer toe(s) (acquired), right foot: Secondary | ICD-10-CM | POA: Diagnosis not present

## 2016-12-28 DIAGNOSIS — I83892 Varicose veins of left lower extremities with other complications: Secondary | ICD-10-CM | POA: Diagnosis not present

## 2017-01-04 ENCOUNTER — Other Ambulatory Visit: Payer: Self-pay | Admitting: Vascular Surgery

## 2017-01-04 DIAGNOSIS — I83892 Varicose veins of left lower extremities with other complications: Secondary | ICD-10-CM

## 2017-01-22 DIAGNOSIS — Z201 Contact with and (suspected) exposure to tuberculosis: Secondary | ICD-10-CM | POA: Diagnosis not present

## 2017-02-05 ENCOUNTER — Encounter: Payer: Self-pay | Admitting: Vascular Surgery

## 2017-02-16 ENCOUNTER — Encounter: Payer: Self-pay | Admitting: Vascular Surgery

## 2017-02-16 ENCOUNTER — Ambulatory Visit (INDEPENDENT_AMBULATORY_CARE_PROVIDER_SITE_OTHER): Payer: PPO | Admitting: Vascular Surgery

## 2017-02-16 ENCOUNTER — Ambulatory Visit (HOSPITAL_COMMUNITY)
Admission: RE | Admit: 2017-02-16 | Discharge: 2017-02-16 | Disposition: A | Discharge status: Home / Self Care | Payer: PPO | Source: Ambulatory Visit | Attending: Vascular Surgery | Admitting: Vascular Surgery

## 2017-02-16 VITALS — BP 132/70 | HR 51 | Temp 96.9°F | Resp 16 | Wt 200.0 lb

## 2017-02-16 DIAGNOSIS — R600 Localized edema: Secondary | ICD-10-CM | POA: Diagnosis not present

## 2017-02-16 DIAGNOSIS — I83892 Varicose veins of left lower extremities with other complications: Secondary | ICD-10-CM | POA: Insufficient documentation

## 2017-02-16 NOTE — Progress Notes (Signed)
Subjective:     Patient ID: Anna Mcconnell, female   DOB: 02-16-1931, 83 y.o.   MRN: [id number]  HPI This 83 year old female was referred by Dr. Posey Pronto for evaluation of varicose veins in the left leg. The patient had a vein stripping of her left great and right great saphenous vein performed in 1964 in [place]. She had varicose veins following a pregnancy which did not resolve. She has done well for many years but has developed some recurrent varicose veins particularly in the left foot area over the last several years. She has developed significant pain in the top of the left foot which frequently occurs after she arises. She does have bunions and has been told she needs the bunions repaired but she has not had this done. She has no history of DVT thrombophlebitis stasis ulcers or bleeding. She does develop mild swelling in both legs is a day progresses.  Past Medical History:  Diagnosis Date  . Arthritis   . HTN (hypertension)   . Spinal stenosis   . Varicose veins of left lower extremity with edema     Social History  Substance Use Topics  . Smoking status: Former Research scientist (life sciences)  . Smokeless tobacco: Never Used     Comment: Quit smoking x 25 years  . Alcohol use Not on file    Family History  Problem Relation Age of Onset  . Tuberculosis Mother     deceased at young age  . Tuberculosis Father     deceased at young age  . Colon cancer Neg Hx     Allergies  Allergen Reactions  . Psyllium Other (See Comments)    bloats     Current Outpatient Prescriptions:  .  atenolol (TENORMIN) 50 MG tablet, Take 50 mg by mouth daily. , Disp: , Rfl:  .  diclofenac sodium (VOLTAREN) 1 % GEL, , Disp: , Rfl:  .  hydrochlorothiazide (HYDRODIURIL) 25 MG tablet, Take 25 mg by mouth daily. , Disp: , Rfl:  .  LORazepam (ATIVAN) 1 MG tablet, Take 1 mg by mouth daily as needed for anxiety. Only as needed     Not even every daily, Disp: , Rfl:  .  naproxen (NAPROSYN) 500 MG tablet, Take 500 mg by  mouth 2 (two) times daily with a meal., Disp: , Rfl:  .  polyethylene glycol (MIRALAX / GLYCOLAX) packet, Take 17 g by mouth daily., Disp: , Rfl:  .  traMADol (ULTRAM) 50 MG tablet, Take 50 mg by mouth daily as needed (pain). , Disp: , Rfl:  .  Vitamin D, Ergocalciferol, (DRISDOL) 50000 UNITS CAPS capsule, Take 50,000 Units by mouth every 7 (seven) days. Fridays, Disp: , Rfl:  .  ketoconazole (NIZORAL) 2 % cream, Apply 1 application topically 2 (two) times daily. Continue for at least 48 hours after rash resolves. (Patient not taking: Reported on 02/16/2017), Disp: 60 g, Rfl: 2 .  LINZESS 290 MCG CAPS capsule, TAKE ONE CAPSULE BY MOUTH DAILY ON AN EMPTY STOMACH. (Patient not taking: Reported on 02/16/2017), Disp: 90 capsule, Rfl: 3  Vitals:   02/16/17 1346  BP: 132/70  Pulse: (!) 51  Resp: 16  Temp: (!) 96.9 F (36.1 C)  SpO2: 99%  Weight: 200 lb (90.7 kg)    Body mass index is 34.33 kg/m.         Review of Systems Denies chest pain does have mild dyspnea on exertion. Denies hemoptysis, PND, orthopnea. Does have spinal stenosis, bunions    Objective:  Physical Exam BP 132/70 (BP Location: Left Arm, Patient Position: Sitting, Cuff Size: Large)   Pulse (!) 51   Temp (!) 96.9 F (36.1 C)   Resp 16   Wt 200 lb (90.7 kg)   SpO2 99%   BMI 34.33 kg/m     Gen.-alert and oriented x3 in no apparent distress HEENT normal for age Lungs no rhonchi or wheezing Cardiovascular regular rhythm no murmurs carotid pulses 3+ palpable no bruits audible Abdomen soft nontender no palpable masses Musculoskeletal free of  major deformities Skin clear -no rashes Neurologic normal Lower extremities 3+ femoral and dorsalis pedis pulses palpable bilaterally with no edema on the right trace edema on the left No bulging varicosities noted in either leg Network of reticular and spider veins in the lower third of the left leg extending onto the dorsum of the left foot. Linear scar present on the  dorsum of the left foot. Hyperpigmentation or ulceration noted. Good Doppler flow in the left DP and PT  Today I ordered a venous duplex exam the left leg which I reviewed and interpreted. There is no DVT. The left great saphenous vein is absent from the saphenofemoral junction to the ankle consistent with previous stripping procedure. Left small saphenous vein is present and normal There is no reflux in the superficial femoral or popliteal veins and there is no DVT      Assessment:     #1 status post bilateral great saphenous vein strippings in 1964 with some recurrent reticular and spider veins dorsum left foot #2 pain dorsum left foot-unlikely to be related to #1 #3 spinal stenosis #4 bunions left foot    Plan:     There is no indication for intervention on the venous system with the left great saphenous vein being absent and the small saphenous vein being normal Do not think the pain she is experiencing is related to her vasculature. She has excellent arterial flow and although venous disease-spider veins-are present in the area of pain-unlikely these would be causing that severe pain #3 have recommended short leg elastic compression stockings 10-20 millimeter gradient to be placed early in the morning If this does not improve her situation should should be evaluated by a foot orthopedic surgeon such as Dr. Wylene Simmer or Dr. Beverely Low duda  return to see me on a when necessary basis

## 2017-02-22 ENCOUNTER — Ambulatory Visit (INDEPENDENT_AMBULATORY_CARE_PROVIDER_SITE_OTHER): Payer: PPO | Admitting: Orthopedic Surgery

## 2017-02-22 ENCOUNTER — Encounter: Payer: Self-pay | Admitting: Orthopedic Surgery

## 2017-02-22 DIAGNOSIS — M170 Bilateral primary osteoarthritis of knee: Secondary | ICD-10-CM

## 2017-02-22 DIAGNOSIS — M25561 Pain in right knee: Secondary | ICD-10-CM

## 2017-02-22 DIAGNOSIS — M25562 Pain in left knee: Secondary | ICD-10-CM

## 2017-02-22 DIAGNOSIS — G8929 Other chronic pain: Secondary | ICD-10-CM

## 2017-02-22 NOTE — Patient Instructions (Signed)

## 2017-02-22 NOTE — Progress Notes (Signed)
Chief Complaint  Patient presents with  . Follow-up    BILATERAL KNEE PAIN, REQUESTS INJECTIONS     Procedure note left knee injection verbal consent was obtained to inject left knee joint  Timeout was completed to confirm the site of injection  The medications used were 40 mg of Depo-Medrol and 1% lidocaine 3 cc  Anesthesia was provided by ethyl chloride and the skin was prepped with alcohol.  After cleaning the skin with alcohol a 20-gauge needle was used to inject the left knee joint. There were no complications. A sterile bandage was applied.   Procedure note right knee injection verbal consent was obtained to inject right knee joint  Timeout was completed to confirm the site of injection  The medications used were 40 mg of Depo-Medrol and 1% lidocaine 3 cc  Anesthesia was provided by ethyl chloride and the skin was prepped with alcohol.  After cleaning the skin with alcohol a 20-gauge needle was used to inject the right knee joint. There were no complications. A sterile bandage was applied.

## 2017-03-09 DIAGNOSIS — G5602 Carpal tunnel syndrome, left upper limb: Secondary | ICD-10-CM | POA: Diagnosis not present

## 2017-03-09 DIAGNOSIS — N183 Chronic kidney disease, stage 3 (moderate): Secondary | ICD-10-CM | POA: Diagnosis not present

## 2017-03-09 DIAGNOSIS — I10 Essential (primary) hypertension: Secondary | ICD-10-CM | POA: Diagnosis not present

## 2017-03-09 DIAGNOSIS — M170 Bilateral primary osteoarthritis of knee: Secondary | ICD-10-CM | POA: Diagnosis not present

## 2017-04-06 ENCOUNTER — Ambulatory Visit (INDEPENDENT_AMBULATORY_CARE_PROVIDER_SITE_OTHER): Payer: PPO

## 2017-04-06 ENCOUNTER — Ambulatory Visit (INDEPENDENT_AMBULATORY_CARE_PROVIDER_SITE_OTHER): Payer: PPO | Admitting: Orthopedic Surgery

## 2017-04-06 ENCOUNTER — Encounter: Payer: Self-pay | Admitting: Orthopedic Surgery

## 2017-04-06 VITALS — BP 133/77 | HR 68 | Ht 64.0 in | Wt 198.0 lb

## 2017-04-06 DIAGNOSIS — M25572 Pain in left ankle and joints of left foot: Secondary | ICD-10-CM

## 2017-04-06 DIAGNOSIS — S93492A Sprain of other ligament of left ankle, initial encounter: Secondary | ICD-10-CM | POA: Diagnosis not present

## 2017-04-06 NOTE — Progress Notes (Signed)
  NEW PATIENT OFFICE VISIT    Chief Complaint  Patient presents with  . Ankle Injury    left ankle injury on 04/03/17    83 year old female was trimming her rose bushes twisted her left ankle had immediate swelling. She injured herself on Saturday, May 5. She complains of new onset dull lateral ankle pain with swelling and decreased range of motion painful but she can weight-bear    Review of Systems  Constitutional: Negative for fever.  Musculoskeletal: Positive for falls.  Skin: Negative for rash.  Neurological: Negative for tingling.     Past Medical History:  Diagnosis Date  . Arthritis   . HTN (hypertension)   . Spinal stenosis   . Varicose veins of left lower extremity with edema     Past Surgical History:  Procedure Laterality Date  . CHOLECYSTECTOMY    . COLONOSCOPY  09/06/2006   MEB:RAXENM rectum.  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  Sigmoid diverticula.  The remainder of the colonic mucosa normal  . COLONOSCOPY N/A 09/25/2014   RMR:No rectal mass on digital rectal exam or with colonoscopic visualization. Internal hemorrhoids. Poor prep precluded complete examination, limited to sigmoid colon.  . COLONOSCOPY N/A 04/11/2015   [id number] diverticulosis  . FRACTURE SURGERY Right    ankle  . hysterectomy    . varicose vein ligation      Family History  Problem Relation Age of Onset  . Tuberculosis Mother     deceased at young age  . Tuberculosis Father     deceased at young age  . Colon cancer Neg Hx    Social History  Substance Use Topics  . Smoking status: Former Research scientist (life sciences)  . Smokeless tobacco: Never Used     Comment: Quit smoking x 25 years  . Alcohol use Not on file    There were no vitals taken for this visit.  Physical Exam  Constitutional: She is oriented to person, place, and time. She appears well-developed and well-nourished.  Neurological: She is alert and oriented to person, place, and time.  Psychiatric: She has a  normal mood and affect.  Vitals reviewed.   Ortho Exam Walking is associated with a limp  The right ankle is swollen and tender in the anterior talofibular ligament there is no bony tenderness she does have decreased dorsiflexion normal plantarflexion no instability on drawer test she has some weakness in plantar flexion dorsiflexion and eversion. The foot is swollen she appears to have some chronic edema but normal pulse and sensation in the skin is normal  The left lower extremity inspection range of motion were normal   Encounter Diagnoses  Name Primary?  . Pain of joint of left ankle and foot Yes  . Sprain of anterior talofibular ligament of left ankle, initial encounter      PLAN:  Imaging of the left ankle AP lateral and oblique: There are no fractures or dislocations noted on the plain films. Ankle mortise is intact    Diagnosis Encounter Diagnoses  Name Primary?  . Pain of joint of left ankle and foot Yes  . Sprain of anterior talofibular ligament of left ankle, initial encounter      Plan  Supportive wrap  Active range of motion  Weight-bear as tolerated  Follow-up as needed

## 2017-04-06 NOTE — Patient Instructions (Addendum)
Contrast Baths  Contrast baths are excellent for joints of the body which have been sprained or strained and have swelling. Professional athletes, to reduce swelling and to resume activity as soon as possible, use them.   Contrast baths consist of placing a foot or hand into a basin of warm water (not hot) for a short period of time and then taking the extremity and moving it immediately into a solution of ice-cold water. It is kept in the ice-cold water for a short period of time, as long as the patient can tolerate, then move back into the warm solution.  The hand or foot is then removed and put back into the cold solution and kept there as long as possible and then removed and not further soaks are done.  When you first start doing this you will experience the so-called 'pins and needles" effect putting your hand into the cold solution. You may only tolerate the ice-cold solution for a period of 5-30 seconds. You will not like this at first. It will hurt. Please be patient and attempt it as best as you can.  In a few days you will enjoy having your foot or hand in the cold water. As the treatment plan progresses, you will then find the "pins and needles" effect taking place after you have had your extremity in the cold solution and then placing it in the warm. It will then be the warm solution that you will dread.  You should, however, notice a decreasing amount of swelling.  After finishing the contrast baths, you need to elevate your foot or ankle so that your toes are higher then your nose for approximately 10-15 minutes. Elevate your hand or wrist above the level of your nose for approximately 10-15 minutes.   You will need to continue doing this for approximately 7-10 days unless otherwise directed.  If you have any questions, please feel free to call the office during office hours.  Arther Abbott, MD   Ankle Sprain An ankle sprain is a stretch or tear in one of the tough,  fiber-like tissues (ligaments) in the ankle. The ligaments in your ankle help to hold the bones of the ankle together. What are the causes? This condition is often caused by stepping on or falling on the outer edge of the foot. What increases the risk? This condition is more likely to develop in people who play sports. What are the signs or symptoms? Symptoms of this condition include:  Pain in your ankle.  Swelling.  Bruising. Bruising may develop right after you sprain your ankle or 1-2 days later.  Trouble standing or walking, especially when you turn or change directions. How is this diagnosed? This condition is diagnosed with a physical exam. During the exam, your health care provider will press on certain parts of your foot and ankle and try to move them in certain ways. X-rays may be taken to see how severe the sprain is and to check for broken bones. How is this treated? This condition may be treated with:  A brace. This is used to keep the ankle from moving until it heals.  An elastic bandage. This is used to support the ankle.  Crutches.  Pain medicine.  Surgery. This may be needed if the sprain is severe.  Physical therapy. This may help to improve the range of motion in the ankle. Follow these instructions at home:  Rest your ankle.  Take over-the-counter and prescription medicines only as told by  your health care provider.  For 2-3 days, keep your ankle raised (elevated) above the level of your heart as much as possible.  If directed, apply ice to the area:  Put ice in a plastic bag.  Place a towel between your skin and the bag.  Leave the ice on for 20 minutes, 2-3 times a day.  If you were given a brace:  Wear it as directed.  Remove it to shower or bathe.  Try not to move your ankle much, but wiggle your toes from time to time. This helps to prevent swelling.  If you were given an elastic bandage (dressing):  Remove it to shower or  bathe.  Try not to move your ankle much, but wiggle your toes from time to time. This helps to prevent swelling.  Adjust the dressing to make it more comfortable if it feels too tight.  Loosen the dressing if you have numbness or tingling in your foot, or if your foot becomes cold and blue.  If you have crutches, use them as told by your health care provider. Continue to use them until you can walk without feeling pain in your ankle. Contact a health care provider if:  You have rapidly increasing bruising or swelling.  Your pain is not relieved with medicine. Get help right away if:  Your toes or foot becomes numb or blue.  You have severe pain that gets worse. This information is not intended to replace advice given to you by your health care provider. Make sure you discuss any questions you have with your health care provider. Document Released: 11/16/2005 Document Revised: 03/25/2016 Document Reviewed: 06/18/2015 Elsevier Interactive Patient Education  2017 [employer].

## 2017-05-18 ENCOUNTER — Encounter: Payer: Self-pay | Admitting: Orthopedic Surgery

## 2017-05-18 ENCOUNTER — Ambulatory Visit (INDEPENDENT_AMBULATORY_CARE_PROVIDER_SITE_OTHER): Payer: PPO | Admitting: Orthopedic Surgery

## 2017-05-18 DIAGNOSIS — M1712 Unilateral primary osteoarthritis, left knee: Secondary | ICD-10-CM | POA: Diagnosis not present

## 2017-05-18 DIAGNOSIS — M25562 Pain in left knee: Secondary | ICD-10-CM

## 2017-05-18 DIAGNOSIS — G8929 Other chronic pain: Secondary | ICD-10-CM

## 2017-05-18 DIAGNOSIS — M25561 Pain in right knee: Secondary | ICD-10-CM

## 2017-05-18 DIAGNOSIS — M1711 Unilateral primary osteoarthritis, right knee: Secondary | ICD-10-CM

## 2017-05-18 NOTE — Progress Notes (Signed)
Follow up  Chief Complaint  Patient presents with  . Follow-up    bilateral knee pain, injections    83 year old female requests bilateral knee injections   Procedure note left knee injection verbal consent was obtained to inject left knee joint  Timeout was completed to confirm the site of injection  The medications used were 40 mg of Depo-Medrol and 1% lidocaine 3 cc  Anesthesia was provided by ethyl chloride and the skin was prepped with alcohol.   After cleaning the skin with alcohol a 20-gauge needle was used to inject the left knee joint. There were no complications. A sterile bandage was applied.   Procedure note right knee injection verbal consent was obtained to inject right knee joint  Timeout was completed to confirm the site of injection  The medications used were 40 mg of Depo-Medrol and 1% lidocaine 3 cc  Anesthesia was provided by ethyl chloride and the skin was prepped with alcohol.   After cleaning the skin with alcohol a 20-gauge needle was used to inject the right knee joint. There were no complications. A sterile bandage was applied.    Return as needed

## 2017-06-10 DIAGNOSIS — L570 Actinic keratosis: Secondary | ICD-10-CM | POA: Diagnosis not present

## 2017-06-10 DIAGNOSIS — Z85828 Personal history of other malignant neoplasm of skin: Secondary | ICD-10-CM | POA: Diagnosis not present

## 2017-06-10 DIAGNOSIS — Z08 Encounter for follow-up examination after completed treatment for malignant neoplasm: Secondary | ICD-10-CM | POA: Diagnosis not present

## 2017-06-10 DIAGNOSIS — Z1283 Encounter for screening for malignant neoplasm of skin: Secondary | ICD-10-CM | POA: Diagnosis not present

## 2017-06-10 DIAGNOSIS — X32XXXD Exposure to sunlight, subsequent encounter: Secondary | ICD-10-CM | POA: Diagnosis not present

## 2017-07-15 DIAGNOSIS — H353210 Exudative age-related macular degeneration, right eye, stage unspecified: Secondary | ICD-10-CM | POA: Diagnosis not present

## 2017-07-18 NOTE — Progress Notes (Signed)
[facility] Note  07/19/2017     CHIEF COMPLAINT Patient presents for Retinal Evaluation   HISTORY OF PRESENT ILLNESS: Anna Mcconnell is a 83 y.o. female who presents to the clinic today for:   HPI    Pt presents for Retinal evaluation on the referral Dr. Radford Pax; Pt states that she is unable to see out of OD x 3 weeks ago; Pt states that she was told that she has "fluid build up behind my retina"; Pt states that she is unable to read; Reports that she has "clear" floaters; Pt denies flashes, denies ocular pain; Pt denies gtts usage, denies taking eye vits;    Last edited by Alyse Low on 07/19/2017  9:15 AM. (History)     Pt reports history of arthritis and receives corticosteroid injections in her knee. Last injection was around May 2018 -- due for repeat injection soon. Denies any other steroid use.  Referring physician: Lemmie Evens, MD [place], [place] [address]  Shanon Brow, [address], [place] [address]   HISTORICAL INFORMATION:   Selected notes from the MEDICAL RECORD NUMBER Referral from Dr. Radford Pax for new-onset floaters and decreased vision OD - onset ~8.11.18  Prior ocular history: pseudophakia OU Gershon Crane, 2013); OHT/POAG suspect    CURRENT MEDICATIONS: No current outpatient prescriptions on file. (Ophthalmic Drugs)   No current facility-administered medications for this visit.  (Ophthalmic Drugs)   Current Outpatient Prescriptions (Other)  Medication Sig  . atenolol (TENORMIN) 50 MG tablet Take 50 mg by mouth daily.   . hydrochlorothiazide (HYDRODIURIL) 25 MG tablet Take 25 mg by mouth daily.   Marland Kitchen ketoconazole (NIZORAL) 2 % cream Apply 1 application topically 2 (two) times daily. Continue for at least 48 hours after rash resolves.  Marland Kitchen LORazepam (ATIVAN) 1 MG tablet Take 1 mg by mouth daily as needed for anxiety. Only as needed     Not even every daily  . naproxen (NAPROSYN) 500 MG tablet Take  500 mg by mouth 2 (two) times daily with a meal.  . polyethylene glycol (MIRALAX / GLYCOLAX) packet Take 17 g by mouth daily.  . traMADol (ULTRAM) 50 MG tablet Take 50 mg by mouth daily as needed (pain).   . Vitamin D, Ergocalciferol, (DRISDOL) 50000 UNITS CAPS capsule Take 50,000 Units by mouth every 7 (seven) days. Fridays   Current Facility-Administered Medications (Other)  Medication Route  . Bevacizumab (AVASTIN) SOLN 1.25 mg Intravitreal      REVIEW OF SYSTEMS:    ALLERGIES Allergies  Allergen Reactions  . Psyllium Other (See Comments)    bloats    PAST MEDICAL HISTORY Past Medical History:  Diagnosis Date  . Arthritis   . HTN (hypertension)   . Spinal stenosis   . Varicose veins of left lower extremity with edema    Past Surgical History:  Procedure Laterality Date  . CATARACT EXTRACTION Bilateral 2013   Dr. Gershon Crane  . CHOLECYSTECTOMY    . COLONOSCOPY  09/06/2006   WGN:FAOZHY rectum.  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  Sigmoid diverticula.  The remainder of the colonic mucosa normal  . COLONOSCOPY N/A 09/25/2014   RMR:No rectal mass on digital rectal exam or with colonoscopic visualization. Internal hemorrhoids. Poor prep precluded complete examination, limited to sigmoid colon.  . COLONOSCOPY N/A 04/11/2015   QMV:HQIONGE diverticulosis  . FRACTURE SURGERY Right    ankle  . hysterectomy    . varicose vein ligation  FAMILY HISTORY Family History  Problem Relation Age of Onset  . Tuberculosis Mother        deceased at young age  . Tuberculosis Father        deceased at young age  . Colon cancer Neg Hx   . Amblyopia Neg Hx   . Cataracts Neg Hx   . Glaucoma Neg Hx   . Macular degeneration Neg Hx   . Retinal detachment Neg Hx   . Strabismus Neg Hx   . Retinitis pigmentosa Neg Hx     SOCIAL HISTORY Social History  Substance Use Topics  . Smoking status: Former Smoker    Quit date: 07/20/2007  . Smokeless tobacco: Never  Used     Comment: Quit smoking x 25 years  . Alcohol use Not on file         OPHTHALMIC EXAM:  Base Eye Exam    Visual Acuity (Snellen - Linear)      Right Left   Dist cc CF at 1' 20/25   Dist ph cc NI NI   Correction:  Glasses       Tonometry (Applanation, 9:27 AM)      Right Left   Pressure 21 22       Pupils      Dark Light Shape React APD   Right 3 2 Round Slow None   Left 3 2 Round Slow None       Visual Fields (Counting fingers)      Left Right    Full    Restrictions  Central scotoma       Extraocular Movement      Right Left    Full Full       Neuro/Psych    Oriented x3:  Yes   Mood/Affect:  Normal       Dilation    Both eyes:  1.0% Mydriacyl, 2.5% Phenylephrine @ 9:27 AM        Slit Lamp and Fundus Exam    External Exam      Right Left   External Normal Normal       Slit Lamp Exam      Right Left   Lids/Lashes Dermatochalasis - upper lid Dermatochalasis - upper lid   Conjunctiva/Sclera Pinguecula Pinguecula   Cornea early band K  at 3 o'clock and at 9 o'clock early band K  at 3 o'clock and at 9 o'clock   Anterior Chamber Deep and quiet Deep and quiet   Iris Round and reactive; dilated Round and reactive; dilated   Lens Posterior chamber intraocular lens, Posterior capsular opacification - mild;  Posterior chamber intraocular lens, Posterior capsular opacification - mild;    Vitreous Vitreous syneresis; PVD Vitreous syneresis; PVD       Fundus Exam      Right Left   Disc Normal Normal   C/D Ratio 0.3 0.3   Macula Severe edema; ?CNV Flat with RPE changes, druplets; trace ERM   Vessels Normal Normal   Periphery Flat; reticlaur degeneration flat; reticular degeneration        Refraction    Wearing Rx      Sphere Cylinder Axis Add   Right -1.25 +2.25 005 +2.50   Left -0.75 +1.50 010 +2.50   Age:  12yrs       Manifest Refraction      Sphere Cylinder Axis Dist VA   Right -1.25 +2.25 005 20/CF (NI)   Left -0.75 +1.50 010 20/25  IMAGING AND PROCEDURES  Imaging and Procedures for 07/19/17  OCT, Retina - OU - Both Eyes     Right Eye Quality was good. Central Foveal Thickness: 787. Progression has no prior data. Findings include abnormal foveal contour, choroidal neovascular membrane, intraretinal fluid, pigment epithelial detachment (Massive intraretinal fluid pocket).   Left Eye Quality was good. Progression has no prior data. Findings include abnormal foveal contour, epiretinal membrane (Retinal drusen).   Notes Images taken, stored on drive      Fluorescein Angiography Optos (Transit OD)     Right Eye Progression has no prior data. Early phase findings include leakage, choroidal neovascularization, microaneurysm, blockage. Mid/Late phase findings include leakage, choroidal neovascularization, microaneurysm, blockage. Choroidal neovascularization is subfoveal.   Left Eye Progression has no prior data. Early phase findings include microaneurysm. Mid/Late phase findings include microaneurysm.   Notes OD - central blockage; 360 perifoveal hyperfluorescence increasing with time consistent with leakage / CNV OS - scattered punctate hyperfluorescence consistent with microaneurysms / microangiopathy     Intravitreal Injection, Pharmacologic Agent - OD - Right Eye     Time Out 07/19/2017. 12:44 PM. Confirmed correct patient, procedure, site, and patient consented.   Anesthesia Topical anesthesia was used. Anesthetic medications included Lidocaine 2%.   Procedure Preparation included 5% betadine to ocular surface, eyelid speculum. A supplied needle was used.   Injection: 1.25 mg Bevacizumab 100 MG/4ML   NDC: 48185-631-49    Lot: [id number]     Expiration Date: 09/19/2017   Route: Intravitreal   Site: Right Eye  Post-op Post injection exam found visual acuity of at least counting fingers. The patient tolerated the procedure well. There were no complications. The patient received written  and verbal post procedure care education.   Notes                  ASSESSMENT/PLAN:    ICD-10-CM   1. Exudative age-related macular degeneration of right eye with active choroidal neovascularization (HCC) H35.3211 Fluorescein Angiography Optos (Transit OD)    Intravitreal Injection, Pharmacologic Agent - OD - Right Eye    Bevacizumab (AVASTIN) SOLN 1.25 mg  2. Intermediate stage nonexudative age-related macular degeneration of left eye H35.3122 OCT, Retina - OU - Both Eyes    Fluorescein Angiography Optos (Transit OD)  3. Epiretinal membrane (ERM) of left eye H35.372   4. Ocular hypertension, bilateral H40.053   5. Pseudophakia of both eyes Z96.1     1. Exudative age related macular degeneration, right eye. The incidence pathology and anatomy of wet AMD discussed with patient including treatment options with intravitreal anti VEGF agents like Lucentis, Avastin and Eylea.  The ANCHOR, MARINA, CATT and VIEW trials discussed with patient.  Risks of endophthalmitis and vascular occlusive events and atrophic changes discussed with patient.  - massive IRF on OCT, VA CF OD  - active CNVM with significant leakage on FA  - recommend anti-VEGF therapy -- IVA OD  - RBA of treatment discussed, pt wishes to proceed with procedure  - informed consent obtained and signed  - see procedure note  - tolerated procedure well  - f/u in 4 wks for repeat OCT, IVA OD #2  2. Age related macular degeneration, non-exudative, left eye.   The incidence, anatomy, and pathology of dry AMD, risk of progression, and the AREDS and AREDS 2 study including smoking risks discussed with patient.  - monitor  3. Epiretinal membrane, left eye The natural history, anatomy, potential for loss of vision, and treatment options including vitrectomy techniques  and the complications of endophthalmitis, retinal detachment, vitreous hemorrhage, cataract progression and permanent vision loss discussed with the patient.  -  ERM very mild with no symptoms  - monitor  4. Ocular hypertension OU  - IOP mildly elevated today  - will monitor for now with particular attention to IOP OD with intravitreal injections  5. Pseudophakia OU  - h/o of cataract surgery with Gershon Crane in 2013  - doing well  - monitor     Ophthalmic Meds Ordered this visit:  Meds ordered this encounter  Medications  . Bevacizumab (AVASTIN) SOLN 1.25 mg       Return in about 4 weeks (around 08/16/2017) for Dilated Exam, OCT, Injection.  Patient Instructions  Post-Procedure Information for Intravitreal Injection  Some common side effects that could occur include:  . Red eye (there is usually a bleed on the white part/sub - conjunctival space at the point of injection, which clears in a week or two)  . Sore and gritty eye (slight ache and discomfort lasting a day or two)  . 'Blobs' or 'small specks' in your vision ('floaters') might be seen for a few days after the injection. Also, there could be transient flashing lights or swirls of light immediately after the injection  You might notice some discomfort and redness for the first few days after your treatment. This can happen due to the antiseptic used. If your eye becomes progressively red, sensitive to light, swollen and painful, or your vision gets worse after the injection, you must seek medical help. This might indicate infection and normally occurs within the first week after the injection, but the risk is minimal.   If you experience significant pain, worsening redness, or a large drop in vision, call the [facility] [place] immediately: [phone]   There are no special precautions following intravitreal injections. You can travel, and if you have any of the above problems, please see an ophthalmologist. Avoid getting water into your eye or swimming for the first few days.    Explained the diagnoses, plan, and follow up with the patient and they expressed  understanding.  Patient expressed understanding of the importance of proper follow up care.   Gardiner Sleeper, M.D., Ph.D. Vitreoretinal Surgeon [facility] 07/19/17     Abbreviations: M myopia (nearsighted); A astigmatism; H hyperopia (farsighted); P presbyopia; Mrx spectacle prescription;  CTL contact lenses; OD right eye; OS left eye; OU both eyes  XT exotropia; ET esotropia; PEK punctate epithelial keratitis; PEE punctate epithelial erosions; DES dry eye syndrome; MGD meibomian gland dysfunction; ATs artificial tears; PFAT's preservative free artificial tears; [place] nuclear sclerotic cataract; PSC posterior subcapsular cataract; ERM epi-retinal membrane; PVD posterior vitreous detachment; RD retinal detachment; DM diabetes mellitus; DR diabetic retinopathy; NPDR non-proliferative diabetic retinopathy; PDR proliferative diabetic retinopathy; CSME clinically significant macular edema; DME diabetic macular edema; dbh dot blot hemorrhages; CWS cotton wool spot; POAG primary open angle glaucoma; C/D cup-to-disc ratio; HVF humphrey visual field; GVF goldmann visual field; OCT optical coherence tomography; IOP intraocular pressure; BRVO Branch retinal vein occlusion; CRVO central retinal vein occlusion; CRAO central retinal artery occlusion; BRAO branch retinal artery occlusion; RT retinal tear; SB scleral buckle; PPV pars plana vitrectomy; VH Vitreous hemorrhage; PRP panretinal laser photocoagulation; IVK intravitreal kenalog; VMT vitreomacular traction; MH Macular hole;  NVD neovascularization of the disc; NVE neovascularization elsewhere; AREDS age related eye disease study; ARMD age related macular degeneration; POAG primary open angle glaucoma; EBMD epithelial/anterior basement  membrane dystrophy; ACIOL anterior chamber intraocular lens; IOL intraocular lens; PCIOL posterior chamber intraocular lens; Phaco/IOL phacoemulsification with intraocular lens placement; [place] photorefractive  keratectomy; LASIK laser assisted in situ keratomileusis; HTN hypertension; DM diabetes mellitus; COPD chronic obstructive pulmonary disease

## 2017-07-19 ENCOUNTER — Encounter (INDEPENDENT_AMBULATORY_CARE_PROVIDER_SITE_OTHER): Payer: Self-pay | Admitting: Ophthalmology

## 2017-07-19 ENCOUNTER — Ambulatory Visit (INDEPENDENT_AMBULATORY_CARE_PROVIDER_SITE_OTHER): Payer: PPO | Admitting: Ophthalmology

## 2017-07-19 DIAGNOSIS — Z961 Presence of intraocular lens: Secondary | ICD-10-CM

## 2017-07-19 DIAGNOSIS — H353122 Nonexudative age-related macular degeneration, left eye, intermediate dry stage: Secondary | ICD-10-CM | POA: Diagnosis not present

## 2017-07-19 DIAGNOSIS — H35372 Puckering of macula, left eye: Secondary | ICD-10-CM

## 2017-07-19 DIAGNOSIS — H353211 Exudative age-related macular degeneration, right eye, with active choroidal neovascularization: Secondary | ICD-10-CM

## 2017-07-19 DIAGNOSIS — H40053 Ocular hypertension, bilateral: Secondary | ICD-10-CM

## 2017-07-19 MED ORDER — BEVACIZUMAB CHEMO INJECTION 1.25MG/0.05ML SYRINGE FOR KALEIDOSCOPE
1.2500 mg | INTRAVITREAL | Status: AC
  Administered 2017-07-19: 1.25 mg via INTRAVITREAL

## 2017-07-19 NOTE — Patient Instructions (Signed)
Post-Procedure Information for Intravitreal Injection  Some common side effects that could occur include:  . Red eye (there is usually a bleed on the white part/sub - conjunctival space at the point of injection, which clears in a week or two)  . Sore and gritty eye (slight ache and discomfort lasting a day or two)  . 'Blobs' or 'small specks' in your vision ('floaters') might be seen for a few days after the injection. Also, there could be transient flashing lights or swirls of light immediately after the injection  You might notice some discomfort and redness for the first few days after your treatment. This can happen due to the antiseptic used. If your eye becomes progressively red, sensitive to light, swollen and painful, or your vision gets worse after the injection, you must seek medical help. This might indicate infection and normally occurs within the first week after the injection, but the risk is minimal.   If you experience significant pain, worsening redness, or a large drop in vision, call the [facility] [place] immediately: [phone]   There are no special precautions following intravitreal injections. You can travel, and if you have any of the above problems, please see an ophthalmologist. Avoid getting water into your eye or swimming for the first few days.

## 2017-07-28 ENCOUNTER — Other Ambulatory Visit (HOSPITAL_COMMUNITY): Payer: Self-pay | Admitting: Family Medicine

## 2017-07-28 DIAGNOSIS — Z1231 Encounter for screening mammogram for malignant neoplasm of breast: Secondary | ICD-10-CM

## 2017-08-05 ENCOUNTER — Telehealth (INDEPENDENT_AMBULATORY_CARE_PROVIDER_SITE_OTHER): Payer: Self-pay | Admitting: Ophthalmology

## 2017-08-05 NOTE — Telephone Encounter (Signed)
Ms. Chirino called regarding her injection; She stated that her vision is no better and she was concerned and felt that it should have improved; I reiterated to Ms. Kirshenbaum what Dr. [name] had told her at her intial visit, that it would be a series of three injection and then we would reassess, but that she has only had one injection and its normal to not see an improvement with only having had one injection; Pt then asked if she could do water aerobics, per Dr. [name] pt is able to resume all water activites; Pt expressed understanding;

## 2017-08-16 ENCOUNTER — Encounter (INDEPENDENT_AMBULATORY_CARE_PROVIDER_SITE_OTHER): Payer: PPO | Admitting: Ophthalmology

## 2017-08-16 NOTE — Progress Notes (Signed)
[facility] Note  08/17/2017     CHIEF COMPLAINT Patient presents for Follow-up   HISTORY OF PRESENT ILLNESS: Anna Mcconnell is a 83 y.o. female who presents to the clinic today for:   HPI    Pt presents for IVJ OD; Pt wishes to proceed; Pt reports OD VA has not improved at all; Pt states she has wavy VA OD; Pt denies floaters, denies flashes, denies gtts usage, denies taking vits;    Last edited by Alyse Low on 08/17/2017  9:20 AM. (History)     Pt reports history of arthritis and receives corticosteroid injections in her knee. Last injection was around May 2018 -- due for repeat injection soon. Denies any other steroid use.  Referring physician: Lemmie Evens, MD [place], [place] [id number]  Anna Mcconnell, [address] [place],  [id number]   HISTORICAL INFORMATION:   Selected notes from the MEDICAL RECORD NUMBER Referral from Dr. Radford Pax for new-onset floaters and decreased vision OD - onset ~8.11.18  Prior ocular history: pseudophakia OU Gershon Crane, 2013); OHT/POAG suspect    CURRENT MEDICATIONS: No current outpatient prescriptions on file. (Ophthalmic Drugs)   No current facility-administered medications for this visit.  (Ophthalmic Drugs)   Current Outpatient Prescriptions (Other)  Medication Sig  . atenolol (TENORMIN) 50 MG tablet Take 50 mg by mouth daily.   . hydrochlorothiazide (HYDRODIURIL) 25 MG tablet Take 25 mg by mouth daily.   Marland Kitchen ketoconazole (NIZORAL) 2 % cream Apply 1 application topically 2 (two) times daily. Continue for at least 48 hours after rash resolves.  Marland Kitchen LORazepam (ATIVAN) 1 MG tablet Take 1 mg by mouth daily as needed for anxiety. Only as needed     Not even every daily  . naproxen (NAPROSYN) 500 MG tablet Take 500 mg by mouth 2 (two) times daily with a meal.  . polyethylene glycol (MIRALAX / GLYCOLAX) packet Take 17 g by mouth daily.  . traMADol (ULTRAM) 50 MG tablet Take 50 mg by  mouth daily as needed (pain).   . Vitamin D, Ergocalciferol, (DRISDOL) 50000 UNITS CAPS capsule Take 50,000 Units by mouth every 7 (seven) days. Fridays   Current Facility-Administered Medications (Other)  Medication Route  . Bevacizumab (AVASTIN) SOLN 1.25 mg Intravitreal  . Bevacizumab (AVASTIN) SOLN 1.25 mg Intravitreal      REVIEW OF SYSTEMS: ROS    Positive for: Eyes   Negative for: Constitutional, Gastrointestinal, Neurological, Skin, Genitourinary, Musculoskeletal, HENT, Endocrine, Cardiovascular, Respiratory, Psychiatric, Allergic/Imm, Heme/Lymph   Last edited by Alyse Low on 08/17/2017  9:20 AM. (History)       ALLERGIES Allergies  Allergen Reactions  . Psyllium Other (See Comments)    bloats    PAST MEDICAL HISTORY Past Medical History:  Diagnosis Date  . Arthritis   . HTN (hypertension)   . Spinal stenosis   . Varicose veins of left lower extremity with edema    Past Surgical History:  Procedure Laterality Date  . CATARACT EXTRACTION Bilateral 2013   Dr. Gershon Crane  . CHOLECYSTECTOMY    . COLONOSCOPY  09/06/2006   DPO:EUMPNT rectum.  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  Sigmoid diverticula.  The remainder of the colonic mucosa normal  . COLONOSCOPY N/A 09/25/2014   RMR:No rectal mass on digital rectal exam or with colonoscopic visualization. Internal hemorrhoids. Poor prep precluded complete examination, limited to sigmoid colon.  . COLONOSCOPY N/A 04/11/2015   IRW:ERXVQMG diverticulosis  . FRACTURE SURGERY  Right    ankle  . hysterectomy    . varicose vein ligation      FAMILY HISTORY Family History  Problem Relation Age of Onset  . Tuberculosis Mother        deceased at young age  . Tuberculosis Father        deceased at young age  . Colon cancer Neg Hx   . Amblyopia Neg Hx   . Cataracts Neg Hx   . Glaucoma Neg Hx   . Macular degeneration Neg Hx   . Retinal detachment Neg Hx   . Strabismus Neg Hx   . Retinitis  pigmentosa Neg Hx     SOCIAL HISTORY Social History  Substance Use Topics  . Smoking status: Former Smoker    Quit date: 07/20/2007  . Smokeless tobacco: Never Used     Comment: Quit smoking x 25 years  . Alcohol use Not on file         OPHTHALMIC EXAM:  Base Eye Exam    Visual Acuity (Snellen - Linear)      Right Left   Dist cc CF at 1' 20/25   Dist ph cc NI NI   Correction:  Glasses       Tonometry (Tonopen, 9:25 AM)      Right Left   Pressure 21 21       Pupils      Dark Light Shape React APD   Right 3 2 Round Slow None   Left 3 2 Round Slow None       Visual Fields      Left Right    Full    Restrictions  Central scotoma       Extraocular Movement      Right Left    Full, Ortho Full, Ortho       Neuro/Psych    Oriented x3:  Yes   Mood/Affect:  Normal       Dilation    Both eyes:  1.0% Mydriacyl, 2.5% Phenylephrine @ 9:25 AM        Slit Lamp and Fundus Exam    External Exam      Right Left   External Normal Normal       Slit Lamp Exam      Right Left   Lids/Lashes Dermatochalasis - upper lid Dermatochalasis - upper lid   Conjunctiva/Sclera Pinguecula Pinguecula   Cornea early band K  at 3 o'clock and at 9 o'clock early band K  at 3 o'clock and at 9 o'clock   Anterior Chamber Deep and quiet Deep and quiet   Iris Round and reactive; dilated Round and reactive; dilated   Lens Posterior chamber intraocular lens, Posterior capsular opacification - mild;  Posterior chamber intraocular lens, Posterior capsular opacification - mild;    Vitreous Vitreous syneresis; PVD Vitreous syneresis; PVD       Fundus Exam      Right Left   Disc Normal Normal   C/D Ratio 0.3 0.3   Macula Severe edema w/ exudation; macular pucker; no heme Flat with RPE changes, druplets; trace ERM   Vessels Normal Normal   Periphery Flat; reticlaur degeneration flat; reticular degeneration        Refraction    Wearing Rx      Sphere Cylinder Axis Add   Right -1.25  +2.25 005 +2.50   Left -0.75 +1.50 010 +2.50          IMAGING AND PROCEDURES  Imaging and Procedures  for 08/17/17  OCT, Retina - OU - Both Eyes     Right Eye Quality was good. Central Foveal Thickness: 761. Progression has improved. Findings include abnormal foveal contour, choroidal neovascular membrane, intraretinal fluid, pigment epithelial detachment, subretinal fluid (Massive intraretinal fluid pocket).   Left Eye Quality was good. Central Foveal Thickness: 362. Progression has been stable. Findings include abnormal foveal contour, epiretinal membrane (Retinal drusen).   Notes Images taken, stored on drive   Diagnosis / Impression:  OD: exudative ARMD with massive SRF, PED and ?RPE rip, mild interval improvement OS: nonexudative ARMD w/ drusen; ERM  Clinical management:  See below  Abbreviations: NFP - Normal foveal profile. CME - cystoid macular edema. PED - pigment epithelial detachment. IRF - intraretinal fluid. SRF - subretinal fluid. EZ - ellipsoid zone. ERM - epiretinal membrane. ORA - outer retinal atrophy. ORT - outer retinal tubulation. SRHM - subretinal hyper-reflective material       Intravitreal Injection, Pharmacologic Agent - OD - Right Eye     Time Out 08/17/2017. 9:54 AM. Confirmed correct patient, procedure, site, and patient consented.   Anesthesia Topical anesthesia was used. Anesthetic medications included Lidocaine 2%, Tetracaine 0.5%.   Procedure Preparation included 5% betadine to ocular surface, eyelid speculum. A supplied needle was used.   Injection: 1.25 mg Bevacizumab 1.25mg /0.91ml   NDC: 20254-270-62    Lot: [id number]     Expiration Date: 09/19/2017   Route: Intravitreal   Site: Right Eye   Waste: 98.75 mg  Post-op Post injection exam found visual acuity of at least counting fingers. The patient tolerated the procedure well. There were no complications. The patient received written and verbal post procedure care education.                ASSESSMENT/PLAN:    ICD-10-CM   1. Exudative age-related macular degeneration of right eye with active choroidal neovascularization (HCC) H35.3211 OCT, Retina - OU - Both Eyes    Intravitreal Injection, Pharmacologic Agent - OD - Right Eye    Bevacizumab (AVASTIN) SOLN 1.25 mg  2. Intermediate stage nonexudative age-related macular degeneration of left eye H35.3122   3. Epiretinal membrane (ERM) of left eye H35.372   4. Ocular hypertension, bilateral H40.053   5. Pseudophakia of both eyes Z96.1     1. Exudative age related macular degeneration, right eye. The incidence pathology and anatomy of wet AMD discussed with patient including treatment options with intravitreal anti VEGF agents like Lucentis, Avastin and Eylea.  The ANCHOR, MARINA, CATT and VIEW trials discussed with patient.  Risks of endophthalmitis and vascular occlusive events and atrophic changes discussed with patient.  - at initial visit: massive IRF/SRF on OCT, VA CF OD; active CNVM with significant leakage   - s/p IVA OD #1 (8.20.18)  - today, OCT shows mild interval improvement in SRF; VA still CF OD  - recommend repeat IVA OD #2 today (9.18.18)  - pt signed Eylea 4 U paperwork  - f/u 4 wks for OCT, repeat IVA OD  2. Age related macular degeneration, non-exudative, left eye.   The incidence, anatomy, and pathology of dry AMD, risk of progression, and the AREDS and AREDS 2 study including smoking risks discussed with patient.  - monitor  3. Epiretinal membrane, left eye The natural history, anatomy, potential for loss of vision, and treatment options including vitrectomy techniques and the complications of endophthalmitis, retinal detachment, vitreous hemorrhage, cataract progression and permanent vision loss discussed with the patient.  - ERM very mild with no  symptoms  - monitor  4. Ocular hypertension OU  - IOP borderline today  - will continue to monitor for now with particular attention to IOP  OD with intravitreal injections  5. Pseudophakia OU  - h/o of cataract surgery with Gershon Crane in 2013  - doing well  - monitor     Ophthalmic Meds Ordered this visit:  Meds ordered this encounter  Medications  . Bevacizumab (AVASTIN) SOLN 1.25 mg       Return in about 4 weeks (around 09/14/2017) for OCT, Injection.  There are no Patient Instructions on file for this visit.   Explained the diagnoses, plan, and follow up with the patient and they expressed understanding.  Patient expressed understanding of the importance of proper follow up care.   Gardiner Sleeper, M.D., Ph.D. Vitreoretinal Surgeon [facility] 08/17/17     Abbreviations: M myopia (nearsighted); A astigmatism; H hyperopia (farsighted); P presbyopia; Mrx spectacle prescription;  CTL contact lenses; OD right eye; OS left eye; OU both eyes  XT exotropia; ET esotropia; PEK punctate epithelial keratitis; PEE punctate epithelial erosions; DES dry eye syndrome; MGD meibomian gland dysfunction; ATs artificial tears; PFAT's preservative free artificial tears; [place] nuclear sclerotic cataract; PSC posterior subcapsular cataract; ERM epi-retinal membrane; PVD posterior vitreous detachment; RD retinal detachment; DM diabetes mellitus; DR diabetic retinopathy; NPDR non-proliferative diabetic retinopathy; PDR proliferative diabetic retinopathy; CSME clinically significant macular edema; DME diabetic macular edema; dbh dot blot hemorrhages; CWS cotton wool spot; POAG primary open angle glaucoma; C/D cup-to-disc ratio; HVF humphrey visual field; GVF goldmann visual field; OCT optical coherence tomography; IOP intraocular pressure; BRVO Branch retinal vein occlusion; CRVO central retinal vein occlusion; CRAO central retinal artery occlusion; BRAO branch retinal artery occlusion; RT retinal tear; SB scleral buckle; PPV pars plana vitrectomy; VH Vitreous hemorrhage; PRP panretinal laser photocoagulation; IVK  intravitreal kenalog; VMT vitreomacular traction; MH Macular hole;  NVD neovascularization of the disc; NVE neovascularization elsewhere; AREDS age related eye disease study; ARMD age related macular degeneration; POAG primary open angle glaucoma; EBMD epithelial/anterior basement membrane dystrophy; ACIOL anterior chamber intraocular lens; IOL intraocular lens; PCIOL posterior chamber intraocular lens; Phaco/IOL phacoemulsification with intraocular lens placement; [place] photorefractive keratectomy; LASIK laser assisted in situ keratomileusis; HTN hypertension; DM diabetes mellitus; COPD chronic obstructive pulmonary disease

## 2017-08-17 ENCOUNTER — Ambulatory Visit (INDEPENDENT_AMBULATORY_CARE_PROVIDER_SITE_OTHER): Payer: PPO | Admitting: Ophthalmology

## 2017-08-17 ENCOUNTER — Encounter (INDEPENDENT_AMBULATORY_CARE_PROVIDER_SITE_OTHER): Payer: Self-pay | Admitting: Ophthalmology

## 2017-08-17 DIAGNOSIS — H40053 Ocular hypertension, bilateral: Secondary | ICD-10-CM

## 2017-08-17 DIAGNOSIS — H353211 Exudative age-related macular degeneration, right eye, with active choroidal neovascularization: Secondary | ICD-10-CM

## 2017-08-17 DIAGNOSIS — H353122 Nonexudative age-related macular degeneration, left eye, intermediate dry stage: Secondary | ICD-10-CM

## 2017-08-17 DIAGNOSIS — Z961 Presence of intraocular lens: Secondary | ICD-10-CM

## 2017-08-17 DIAGNOSIS — H35372 Puckering of macula, left eye: Secondary | ICD-10-CM

## 2017-08-17 MED ORDER — BEVACIZUMAB CHEMO INJECTION 1.25MG/0.05ML SYRINGE FOR KALEIDOSCOPE
1.2500 mg | INTRAVITREAL | Status: AC
  Administered 2017-08-17: 1.25 mg via INTRAVITREAL

## 2017-08-20 ENCOUNTER — Ambulatory Visit (HOSPITAL_COMMUNITY): Payer: PPO

## 2017-08-27 ENCOUNTER — Ambulatory Visit (HOSPITAL_COMMUNITY)
Admission: RE | Admit: 2017-08-27 | Discharge: 2017-08-27 | Disposition: A | Discharge status: Home / Self Care | Payer: PPO | Source: Ambulatory Visit | Attending: Family Medicine | Admitting: Family Medicine

## 2017-08-27 DIAGNOSIS — Z1231 Encounter for screening mammogram for malignant neoplasm of breast: Secondary | ICD-10-CM | POA: Diagnosis not present

## 2017-09-07 DIAGNOSIS — M170 Bilateral primary osteoarthritis of knee: Secondary | ICD-10-CM | POA: Diagnosis not present

## 2017-09-07 DIAGNOSIS — I10 Essential (primary) hypertension: Secondary | ICD-10-CM | POA: Diagnosis not present

## 2017-09-07 DIAGNOSIS — Z23 Encounter for immunization: Secondary | ICD-10-CM | POA: Diagnosis not present

## 2017-09-07 DIAGNOSIS — E6609 Other obesity due to excess calories: Secondary | ICD-10-CM | POA: Diagnosis not present

## 2017-09-07 DIAGNOSIS — E782 Mixed hyperlipidemia: Secondary | ICD-10-CM | POA: Diagnosis not present

## 2017-09-07 DIAGNOSIS — K5901 Slow transit constipation: Secondary | ICD-10-CM | POA: Diagnosis not present

## 2017-09-13 NOTE — Progress Notes (Signed)
[facility] Note  09/14/2017     CHIEF COMPLAINT Patient presents for Post-op Follow-up   HISTORY OF PRESENT ILLNESS: Anna Mcconnell is a 83 y.o. female who presents to the clinic today for:   HPI    Post-op Follow-up  In right eye.  Vision is improved.  I, the attending physician,  performed the HPI with the patient and updated documentation appropriately.        Comments  POV #3 INJ OD/ Injection OD.She has been approved for [employer] . Patient states she feels her Vision has improved  She could  Only see black at the beginning now she can see  profiles. . Denies floaters ,wavy lines, pain.She takes [facility]red[facility]  Bid . Denies using  eye gtts.      Last edited by Bernarda Caffey, MD on 09/14/2017  9:25 AM. (History)     Pt reports history of arthritis and receives corticosteroid injections in her knee. Last injection was around May 2018 -- due for repeat injection soon. Denies any other steroid use.  Referring physician: Lemmie Evens, MD [place], [place] [id number]  [address], [address] [place], [place] [address]   HISTORICAL INFORMATION:   Selected notes from the MEDICAL RECORD NUMBER Referral from Dr. Radford Pax for new-onset floaters and decreased vision OD - onset ~8.11.18  Prior ocular history: pseudophakia OU Gershon Crane, 2013); OHT/POAG suspect    CURRENT MEDICATIONS: No current outpatient prescriptions on file. (Ophthalmic Drugs)   Current Facility-Administered Medications (Ophthalmic Drugs)  Medication Route  . aflibercept (EYLEA) SOLN 2 mg Intravitreal   Current Outpatient Prescriptions (Other)  Medication Sig  . atenolol (TENORMIN) 50 MG tablet Take 50 mg by mouth daily.   . hydrochlorothiazide (HYDRODIURIL) 25 MG tablet Take 25 mg by mouth daily.   Marland Kitchen ketoconazole (NIZORAL) 2 % cream Apply 1 application topically 2 (two) times daily. Continue for at least 48 hours after rash resolves.  Marland Kitchen LORazepam (ATIVAN) 1 MG  tablet Take 1 mg by mouth daily as needed for anxiety. Only as needed     Not even every daily  . Multiple Vitamins-Minerals (ICAPS AREDS FORMULA PO) Take by mouth.  . naproxen (NAPROSYN) 500 MG tablet Take 500 mg by mouth 2 (two) times daily with a meal.  . polyethylene glycol (MIRALAX / GLYCOLAX) packet Take 17 g by mouth daily.  . traMADol (ULTRAM) 50 MG tablet Take 50 mg by mouth daily as needed (pain).   . Vitamin D, Ergocalciferol, (DRISDOL) 50000 UNITS CAPS capsule Take 50,000 Units by mouth every 7 (seven) days. Fridays   Current Facility-Administered Medications (Other)  Medication Route  . Bevacizumab (AVASTIN) SOLN 1.25 mg Intravitreal  . Bevacizumab (AVASTIN) SOLN 1.25 mg Intravitreal      REVIEW OF SYSTEMS: ROS    Positive for: Gastrointestinal, Musculoskeletal, Cardiovascular, Eyes, Respiratory, Heme/Lymph   Negative for: Constitutional, Neurological, Skin, Genitourinary, HENT, Endocrine, Psychiatric, Allergic/Imm   Last edited by Zenovia Jordan, LPN on 56/31/4970  2:63 AM. (History)       ALLERGIES Allergies  Allergen Reactions  . Psyllium Other (See Comments)    bloats    PAST MEDICAL HISTORY Past Medical History:  Diagnosis Date  . Arthritis   . HTN (hypertension)   . Spinal stenosis   . Varicose veins of left lower extremity with edema    Past Surgical History:  Procedure Laterality Date  . CATARACT EXTRACTION Bilateral 2013   Dr. Gershon Crane  .  CHOLECYSTECTOMY    . COLONOSCOPY  09/06/2006   YWV:PXTGGY rectum.  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  Sigmoid diverticula.  The remainder of the colonic mucosa normal  . COLONOSCOPY N/A 09/25/2014   RMR:No rectal mass on digital rectal exam or with colonoscopic visualization. Internal hemorrhoids. Poor prep precluded complete examination, limited to sigmoid colon.  . COLONOSCOPY N/A 04/11/2015   IRS:WNIOEVO diverticulosis  . FRACTURE SURGERY Right    ankle  . hysterectomy    .  varicose vein ligation      FAMILY HISTORY Family History  Problem Relation Age of Onset  . Tuberculosis Mother        deceased at young age  . Tuberculosis Father        deceased at young age  . Colon cancer Neg Hx   . Amblyopia Neg Hx   . Cataracts Neg Hx   . Glaucoma Neg Hx   . Macular degeneration Neg Hx   . Retinal detachment Neg Hx   . Strabismus Neg Hx   . Retinitis pigmentosa Neg Hx     SOCIAL HISTORY Social History  Substance Use Topics  . Smoking status: Former Smoker    Quit date: 07/20/2007  . Smokeless tobacco: Never Used     Comment: Quit smoking x 25 years  . Alcohol use Not on file         OPHTHALMIC EXAM:  Base Eye Exam    Visual Acuity (Snellen - Linear)      Right Left   Dist cc 20/400-2 20/20-2   Dist ph cc 20/400-1 NI   Correction:  Glasses       Tonometry (Tonopen, 9:42 AM)      Right Left   Pressure 21 23       Pupils      Dark Light Shape React APD   Right 3 2 Round Slow None   Left 3 2 Round Slow None       Visual Fields      Left Right    Full    Restrictions  Central scotoma       Extraocular Movement      Right Left    Full, Ortho Full, Ortho       Neuro/Psych    Oriented x3:  Yes   Mood/Affect:  Normal       Dilation    Both eyes:  1.0% Mydriacyl, 2.5% Phenylephrine @ 9:45 AM        Slit Lamp and Fundus Exam    External Exam      Right Left   External Normal Normal       Slit Lamp Exam      Right Left   Lids/Lashes Dermatochalasis - upper lid Dermatochalasis - upper lid   Conjunctiva/Sclera Pinguecula Pinguecula   Cornea early band K  at 3 o'clock and at 9 o'clock early band K  at 3 o'clock and at 9 o'clock   Anterior Chamber Deep and quiet Deep and quiet   Iris Round and reactive; dilated Round and reactive; dilated   Lens Posterior chamber intraocular lens, Posterior capsular opacification - mild;  Posterior chamber intraocular lens, Posterior capsular opacification - mild;    Vitreous Vitreous  syneresis; PVD Vitreous syneresis; PVD       Fundus Exam      Right Left   Disc Normal Normal   C/D Ratio 0.3 0.3   Macula persistent edema / SRF w/ exudation; PED / CNVM within  SRF; macular pucker; no heme Flat with RPE changes, druplets; ERM   Vessels Normal Normal   Periphery Flat; reticlaur degeneration flat; reticular degeneration        Refraction    Wearing Rx      Sphere Cylinder Axis Add   Right -1.25 +2.25 005 +2.50   Left -0.75 +1.50 010 +2.50       Manifest Refraction (Retinoscopy, Over)      Sphere Cylinder Axis Dist VA   Right +0.00 +1.50 005 20/300-2   Left -0.25 +1.25 170 20/20-2          IMAGING AND PROCEDURES  Imaging and Procedures for 09/14/17  OCT, Retina - OU - Both Eyes     Right Eye Quality was good. Central Foveal Thickness: 890. Progression has improved. Findings include abnormal foveal contour, choroidal neovascular membrane, pigment epithelial detachment, subretinal fluid (Massive intraretinal fluid pocket).   Left Eye Quality was good. Central Foveal Thickness: 358. Progression has been stable. Findings include abnormal foveal contour, epiretinal membrane (Retinal drusen).   Notes Images taken, stored on drive   OD: Interval improvement in overall subretinal fluid, but still significant     Intravitreal Injection, Pharmacologic Agent - OD - Right Eye     Time Out 09/14/2017. 10:01 AM. Confirmed correct patient, procedure, site, and patient consented.   Anesthesia Topical anesthesia was used. Anesthetic medications included Tetracaine 0.5%, Lidocaine 2%.   Procedure Preparation included 5% betadine to ocular surface. A 30 gauge needle was used.   Injection: 2 mg aflibercept (EYLEA) SOLN 2 mg   NDC: 77412-878-67    Lot: [id number]    Expiration Date: 04/29/2018   Route: Intravitreal   Site: Right Eye  Post-op Post injection exam found visual acuity of at least counting fingers. The patient tolerated the procedure well. There  were no complications. The patient received written and verbal post procedure care education.   Notes PROCEDURE NOTE  DIAGNOSIS:   Exudative age-related macular degeneration, RIGHT EYE  PROCEDURE:  Intravitreal EYLEA injection, RIGHT EYE  SURGEON:  Bernarda Caffey, M.D., Ph.D.   ANESTHESIA:  Topical   The patient was brought to the minor surgery room. Informed consent had already been obtained for intravitreal injection of aflibercept (EYLEA) 2 mg in 0.05 mL. The diagnosis was reviewed with the patient and all questions were answered. The risks of the procedure including potential systemic risks like stroke and heart attack and other thrombotic events as well as the local risks like infection, endophthalmitis, retinal detachment and cataract were discussed. The patient consented to have intravitreal injection.   The eye was marked and a time out was performed identifying the correct eye. Subsequently, the patient was placed in the supine position. A lid speculum was placed to expose the eye. Topical anesthetic drops and gel were given prior to placing the speculum. Subsequently, 4% Lidocaine on a sterile pledget was held over the superotemporal quadrant giving adequate anesthesia. The area was then cleaned extensively with Betadine swabs and allowed to dry. At this time, 4 mm posterior to the limbus utilizing a 30-gauge needle, 0.05 cc of Eylea was injected into the vitreous cavity under direct visualization. The needle was then withdrawn from the eye. Visual function was tested and intact. There were no complications. Discharge instructions were reviewed.  **Of note, a medication sample of Eylea was used for this procedure**               ASSESSMENT/PLAN:    ICD-10-CM   1. Exudative  age-related macular degeneration of right eye with active choroidal neovascularization (HCC) H35.3211 OCT, Retina - OU - Both Eyes    Intravitreal Injection, Pharmacologic Agent - OD - Right Eye     aflibercept (EYLEA) SOLN 2 mg  2. Intermediate stage nonexudative age-related macular degeneration of left eye H35.3122   3. Epiretinal membrane (ERM) of left eye H35.372   4. Ocular hypertension, bilateral H40.053   5. Pseudophakia of both eyes Z96.1     1. Exudative age related macular degeneration, right eye. The incidence pathology and anatomy of wet AMD discussed with patient including treatment options with intravitreal anti VEGF agents like Lucentis, Avastin and Eylea.  The ANCHOR, MARINA, CATT and VIEW trials discussed with patient.  Risks of endophthalmitis and vascular occlusive events and atrophic changes discussed with patient.  - at initial visit: massive IRF/SRF on OCT, VA CF OD; active CNVM with significant leakage   - s/p IVA OD #1 (8.20.18), #2 (9.18.18)  - today, OCT shows mild interval improvement in SRF but still with extensive SRF;   - VA improved to 20/400 OD  - recommend IVE OD today (10.16.18) -- will administer sample today  - pt signed Eylea 4 U paperwork -- pt reports enrollment in co-pay assistance with [employer], which needs to be confirmed  - f/u 4 wks for OCT, IVA vs IVE OD   2. Age related macular degeneration, non-exudative, left eye.   The incidence, anatomy, and pathology of dry AMD, risk of progression, and the AREDS and AREDS 2 study including smoking risks discussed with patient.  - monitor  3. Epiretinal membrane, left eye The natural history, anatomy, potential for loss of vision, and treatment options including vitrectomy techniques and the complications of endophthalmitis, retinal detachment, vitreous hemorrhage, cataract progression and permanent vision loss discussed with the patient.  - ERM very mild with no symptoms  - monitor  4. Ocular hypertension OU  - IOP borderline today  - will continue to monitor for now with particular attention to IOP OD with intravitreal injections  5. Pseudophakia OU  - h/o of cataract surgery with  Gershon Crane in 2013  - doing well  - monitor     Ophthalmic Meds Ordered this visit:  Meds ordered this encounter  Medications  . aflibercept (EYLEA) SOLN 2 mg       Return in about 4 weeks (around 10/12/2017) for Dilated Exam, OCT.  There are no Patient Instructions on file for this visit.   Explained the diagnoses, plan, and follow up with the patient and they expressed understanding.  Patient expressed understanding of the importance of proper follow up care.   Gardiner Sleeper, M.D., Ph.D. Vitreoretinal Surgeon [facility] 09/14/17     Abbreviations: M myopia (nearsighted); A astigmatism; H hyperopia (farsighted); P presbyopia; Mrx spectacle prescription;  CTL contact lenses; OD right eye; OS left eye; OU both eyes  XT exotropia; ET esotropia; PEK punctate epithelial keratitis; PEE punctate epithelial erosions; DES dry eye syndrome; MGD meibomian gland dysfunction; ATs artificial tears; PFAT's preservative free artificial tears; [place] nuclear sclerotic cataract; PSC posterior subcapsular cataract; ERM epi-retinal membrane; PVD posterior vitreous detachment; RD retinal detachment; DM diabetes mellitus; DR diabetic retinopathy; NPDR non-proliferative diabetic retinopathy; PDR proliferative diabetic retinopathy; CSME clinically significant macular edema; DME diabetic macular edema; dbh dot blot hemorrhages; CWS cotton wool spot; POAG primary open angle glaucoma; C/D cup-to-disc ratio; HVF humphrey visual field; GVF goldmann visual field; OCT optical coherence tomography; IOP intraocular  pressure; BRVO Branch retinal vein occlusion; CRVO central retinal vein occlusion; CRAO central retinal artery occlusion; BRAO branch retinal artery occlusion; RT retinal tear; SB scleral buckle; PPV pars plana vitrectomy; VH Vitreous hemorrhage; PRP panretinal laser photocoagulation; IVK intravitreal kenalog; VMT vitreomacular traction; MH Macular hole;  NVD neovascularization of the  disc; NVE neovascularization elsewhere; AREDS age related eye disease study; ARMD age related macular degeneration; POAG primary open angle glaucoma; EBMD epithelial/anterior basement membrane dystrophy; ACIOL anterior chamber intraocular lens; IOL intraocular lens; PCIOL posterior chamber intraocular lens; Phaco/IOL phacoemulsification with intraocular lens placement; [place] photorefractive keratectomy; LASIK laser assisted in situ keratomileusis; HTN hypertension; DM diabetes mellitus; COPD chronic obstructive pulmonary disease

## 2017-09-14 ENCOUNTER — Ambulatory Visit (INDEPENDENT_AMBULATORY_CARE_PROVIDER_SITE_OTHER): Payer: PPO | Admitting: Ophthalmology

## 2017-09-14 ENCOUNTER — Encounter (INDEPENDENT_AMBULATORY_CARE_PROVIDER_SITE_OTHER): Payer: Self-pay | Admitting: Ophthalmology

## 2017-09-14 DIAGNOSIS — H353211 Exudative age-related macular degeneration, right eye, with active choroidal neovascularization: Secondary | ICD-10-CM

## 2017-09-14 DIAGNOSIS — H35372 Puckering of macula, left eye: Secondary | ICD-10-CM | POA: Diagnosis not present

## 2017-09-14 DIAGNOSIS — H40053 Ocular hypertension, bilateral: Secondary | ICD-10-CM | POA: Diagnosis not present

## 2017-09-14 DIAGNOSIS — H353122 Nonexudative age-related macular degeneration, left eye, intermediate dry stage: Secondary | ICD-10-CM | POA: Diagnosis not present

## 2017-09-14 DIAGNOSIS — Z961 Presence of intraocular lens: Secondary | ICD-10-CM

## 2017-09-14 MED ORDER — AFLIBERCEPT 2MG/0.05ML IZ SOLN FOR KALEIDOSCOPE
2.0000 mg | INTRAVITREAL | Status: AC
  Administered 2017-09-14: 2 mg via INTRAVITREAL

## 2017-09-15 ENCOUNTER — Ambulatory Visit (INDEPENDENT_AMBULATORY_CARE_PROVIDER_SITE_OTHER): Payer: PPO | Admitting: Orthopedic Surgery

## 2017-09-15 ENCOUNTER — Ambulatory Visit: Payer: PPO

## 2017-09-15 ENCOUNTER — Ambulatory Visit (INDEPENDENT_AMBULATORY_CARE_PROVIDER_SITE_OTHER): Payer: PPO

## 2017-09-15 ENCOUNTER — Encounter: Payer: Self-pay | Admitting: Orthopedic Surgery

## 2017-09-15 VITALS — BP 131/70 | HR 58 | Ht 64.0 in | Wt 210.0 lb

## 2017-09-15 DIAGNOSIS — M25561 Pain in right knee: Secondary | ICD-10-CM | POA: Diagnosis not present

## 2017-09-15 DIAGNOSIS — M1712 Unilateral primary osteoarthritis, left knee: Secondary | ICD-10-CM

## 2017-09-15 DIAGNOSIS — M25562 Pain in left knee: Secondary | ICD-10-CM | POA: Diagnosis not present

## 2017-09-15 DIAGNOSIS — G8929 Other chronic pain: Secondary | ICD-10-CM

## 2017-09-15 DIAGNOSIS — M1711 Unilateral primary osteoarthritis, right knee: Secondary | ICD-10-CM

## 2017-09-15 MED ORDER — METHYLPREDNISOLONE ACETATE 40 MG/ML IJ SUSP
40.0000 mg | Freq: Once | INTRAMUSCULAR | Status: AC
  Administered 2017-09-15: 40 mg via INTRA_ARTICULAR

## 2017-09-15 NOTE — Progress Notes (Signed)
Progress Note   Patient ID: Anna Mcconnell, female   DOB: 1931/02/02, 83 y.o.   MRN: [id number]  Chief Complaint  Patient presents with  . Knee Pain    both knees     83 year old female bilateral knee pain right worse than left present for evaluation. She's received injections in her knees in the past.  She complains of mild dull aching pain in both knees which is constant but worse at night and worse with getting out of a chair going up and down the stairs. Pain is present now for several years worse in the last few months     Review of Systems  Constitutional: Negative for fever.  Respiratory: Negative for shortness of breath.   Cardiovascular: Negative for chest pain.  Musculoskeletal: Negative for back pain.  Skin: Negative.   Neurological: Negative for tingling and sensory change.   Current Meds  Medication Sig  . atenolol (TENORMIN) 50 MG tablet Take 50 mg by mouth daily.   . hydrochlorothiazide (HYDRODIURIL) 25 MG tablet Take 25 mg by mouth daily.   Marland Kitchen ketoconazole (NIZORAL) 2 % cream Apply 1 application topically 2 (two) times daily. Continue for at least 48 hours after rash resolves.  Marland Kitchen LORazepam (ATIVAN) 1 MG tablet Take 1 mg by mouth daily as needed for anxiety. Only as needed     Not even every daily  . Multiple Vitamins-Minerals (ICAPS AREDS FORMULA PO) Take by mouth.  . naproxen (NAPROSYN) 500 MG tablet Take 500 mg by mouth 2 (two) times daily with a meal.  . polyethylene glycol (MIRALAX / GLYCOLAX) packet Take 17 g by mouth daily.  . traMADol (ULTRAM) 50 MG tablet Take 50 mg by mouth daily as needed (pain).   . Vitamin D, Ergocalciferol, (DRISDOL) 50000 UNITS CAPS capsule Take 50,000 Units by mouth every 7 (seven) days. Fridays   Current Facility-Administered Medications for the 09/15/17 encounter (Office Visit) with Carole Civil, MD  Medication  . aflibercept (EYLEA) SOLN 2 mg  . Bevacizumab (AVASTIN) SOLN 1.25 mg  . Bevacizumab (AVASTIN) SOLN 1.25 mg     Past Medical History:  Diagnosis Date  . Arthritis   . HTN (hypertension)   . Spinal stenosis   . Varicose veins of left lower extremity with edema      Allergies  Allergen Reactions  . Psyllium Other (See Comments)    bloats    Physical Exam Gen. appearance the patient's appearance is normal with normal grooming and  hygiene The patient is oriented to person place and time Mood and affect are normal   Ortho Exam  Gait is slow but steady  Range of motion right knee 120 left knee 125 both knees are stable. She has good quadriceps strength bilaterally skin is intact in both legs neurologically she is intact in both lower extremities with good pulse and perfusion bilaterally no edema she has tenderness on the lateral aspect of the right and left knee as well as the medial aspect without effusion in either knee  Medical decision-making Encounter Diagnoses  Name Primary?  . Chronic pain of both knees Yes  . Arthritis of knee, left   . Arthritis of knee, right       Meds ordered this encounter  Medications  . methylPREDNISolone acetate (DEPO-MEDROL) injection 40 mg  . methylPREDNISolone acetate (DEPO-MEDROL) injection 40 mg   Today's x-rays show bilateral degenerative joint disease quite severe however I think she would benefit from injection and avoid surgery at this time  Procedure note left knee injection verbal consent was obtained to inject left knee joint  Timeout was completed to confirm the site of injection  The medications used were 40 mg of Depo-Medrol and 1% lidocaine 3 cc  Anesthesia was provided by ethyl chloride and the skin was prepped with alcohol.  After cleaning the skin with alcohol a 20-gauge needle was used to inject the left knee joint. There were no complications. A sterile bandage was applied.   Procedure note right knee injection verbal consent was obtained to inject right knee joint  Timeout was completed to confirm the site of  injection  The medications used were 40 mg of Depo-Medrol and 1% lidocaine 3 cc  Anesthesia was provided by ethyl chloride and the skin was prepped with alcohol.  After cleaning the skin with alcohol a 20-gauge needle was used to inject the right knee joint. There were no complications. A sterile bandage was applied.   Arther Abbott, MD 09/15/2017 11:18 AM

## 2017-10-08 NOTE — Progress Notes (Signed)
[facility] Note  10/12/2017     CHIEF COMPLAINT Patient presents for Retina Follow Up   HISTORY OF PRESENT ILLNESS: Anna Mcconnell is a 83 y.o. female who presents to the clinic today for:   HPI    Retina Follow Up    Patient presents with  Wet AMD.  In right eye.  Duration of 4 weeks.  Since onset it is gradually improving.  I, the attending physician,  performed the HPI with the patient and updated documentation appropriately.          Comments    F/U exudative ARM OD, non-exudative ARMD OS;  Pt states she feels that OD VA is slowly improving; Pt states that she feels the injections are helping; Pt denies floaters, denies flashes, denies wavy VA; Pt reports that she tolerated last injection well, pt states that she is ready to proceed with another injection today;        Last edited by Bernarda Caffey, MD on 10/12/2017 11:54 AM. (History)     Pt reports history of arthritis and receives corticosteroid injections in her knee. Last injection was around May 2018 -- due for repeat injection soon. Denies any other steroid use.  Referring physician: Lemmie Evens, MD [place], [place] [address]  Shanon Brow, [address] [place],  [phone]   HISTORICAL INFORMATION:   Selected notes from the MEDICAL RECORD NUMBER Referral from Dr. Radford Pax for new-onset floaters and decreased vision OD - onset ~8.11.18  Prior ocular history: pseudophakia OU Gershon Crane, 2013); OHT/POAG suspect    CURRENT MEDICATIONS: No current outpatient medications on file. (Ophthalmic Drugs)   Current Facility-Administered Medications (Ophthalmic Drugs)  Medication Route  . aflibercept (EYLEA) SOLN 2 mg Intravitreal  . aflibercept (EYLEA) SOLN 2 mg Intravitreal   Current Outpatient Medications (Other)  Medication Sig  . atenolol (TENORMIN) 50 MG tablet Take 50 mg by mouth daily.   . hydrochlorothiazide (HYDRODIURIL) 25 MG tablet Take 25 mg by  mouth daily.   Marland Kitchen ketoconazole (NIZORAL) 2 % cream Apply 1 application topically 2 (two) times daily. Continue for at least 48 hours after rash resolves.  Marland Kitchen LORazepam (ATIVAN) 1 MG tablet Take 1 mg by mouth daily as needed for anxiety. Only as needed     Not even every daily  . Multiple Vitamins-Minerals (ICAPS AREDS FORMULA PO) Take by mouth.  . naproxen (NAPROSYN) 500 MG tablet Take 500 mg by mouth 2 (two) times daily with a meal.  . polyethylene glycol (MIRALAX / GLYCOLAX) packet Take 17 g by mouth daily.  . traMADol (ULTRAM) 50 MG tablet Take 50 mg by mouth daily as needed (pain).   . Vitamin D, Ergocalciferol, (DRISDOL) 50000 UNITS CAPS capsule Take 50,000 Units by mouth every 7 (seven) days. Fridays   Current Facility-Administered Medications (Other)  Medication Route  . Bevacizumab (AVASTIN) SOLN 1.25 mg Intravitreal  . Bevacizumab (AVASTIN) SOLN 1.25 mg Intravitreal      REVIEW OF SYSTEMS: ROS    Positive for: Musculoskeletal, Eyes   Negative for: Constitutional, Gastrointestinal, Neurological, Skin, Genitourinary, HENT, Endocrine, Cardiovascular, Respiratory, Psychiatric, Allergic/Imm, Heme/Lymph   Last edited by Alyse Low on 10/12/2017  9:59 AM. (History)       ALLERGIES Allergies  Allergen Reactions  . Psyllium Other (See Comments)    bloats    PAST MEDICAL HISTORY Past Medical History:  Diagnosis Date  . Arthritis   . HTN (hypertension)   . Spinal  stenosis   . Varicose veins of left lower extremity with edema    Past Surgical History:  Procedure Laterality Date  . CATARACT EXTRACTION Bilateral 2013   Dr. Gershon Crane  . CHOLECYSTECTOMY    . COLONOSCOPY  09/06/2006   [id number] rectum.  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  Sigmoid diverticula.  The remainder of the colonic mucosa normal  . FRACTURE SURGERY Right    ankle  . hysterectomy    . varicose vein ligation      FAMILY HISTORY Family History  Problem Relation Age of  Onset  . Tuberculosis Mother        deceased at young age  . Tuberculosis Father        deceased at young age  . Colon cancer Neg Hx   . Amblyopia Neg Hx   . Cataracts Neg Hx   . Glaucoma Neg Hx   . Macular degeneration Neg Hx   . Retinal detachment Neg Hx   . Strabismus Neg Hx   . Retinitis pigmentosa Neg Hx     SOCIAL HISTORY Social History   Tobacco Use  . Smoking status: Former Smoker    Last attempt to quit: 07/20/2007    Years since quitting: 10.2  . Smokeless tobacco: Never Used  . Tobacco comment: Quit smoking x 25 years  Substance Use Topics  . Alcohol use: Not on file  . Drug use: Not on file         OPHTHALMIC EXAM:  Base Eye Exam    Visual Acuity (Snellen - Linear)      Right Left   Dist cc CF at 3' 20/25   Dist ph cc 20/400 20/20   Correction:  Glasses       Tonometry (Tonopen, 10:07 AM)      Right Left   Pressure 17 24       Pupils      Dark Light Shape React APD   Right 3 2 Round Slow None   Left 3 2 Round Slow None       Visual Fields (Counting fingers)      Left Right    Full    Restrictions  Central scotoma       Extraocular Movement      Right Left    Full, Ortho Full, Ortho       Neuro/Psych    Oriented x3:  Yes   Mood/Affect:  Normal       Dilation    Both eyes:  1.0% Mydriacyl, 2.5% Phenylephrine @ 10:07 AM        Slit Lamp and Fundus Exam    External Exam      Right Left   External Normal Normal       Slit Lamp Exam      Right Left   Lids/Lashes Dermatochalasis - upper lid Dermatochalasis - upper lid   Conjunctiva/Sclera Pinguecula Pinguecula   Cornea early band K  at 3 o'clock and at 9 o'clock early band K  at 3 o'clock and at 9 o'clock   Anterior Chamber Deep and quiet Deep and quiet   Iris Round and reactive; dilated Round and reactive; dilated   Lens Posterior chamber intraocular lens, Posterior capsular opacification - mild;  Posterior chamber intraocular lens, Posterior capsular opacification - mild;     Vitreous Vitreous syneresis; PVD Vitreous syneresis; PVD       Fundus Exam      Right Left   Disc Normal Normal  C/D Ratio 0.3 0.3   Macula persistent edema / SRF w/ exudation; PED / CNVM within SRF; macular pucker; no heme Flat with RPE changes, druplets; trace ERM   Vessels Normal Normal   Periphery Flat; reticlaur degeneration flat; reticular degeneration          IMAGING AND PROCEDURES  Imaging and Procedures for 10/13/17  OCT, Retina - OU - Both Eyes     Right Eye Quality was good. Central Foveal Thickness: 711. Findings include abnormal foveal contour, choroidal neovascular membrane, pigment epithelial detachment, subretinal fluid.   Left Eye Quality was good. Central Foveal Thickness: 355. Progression has been stable. Findings include abnormal foveal contour, epiretinal membrane, retinal drusen .   Notes Images taken, stored on drive  Diagnosis / Impression:  OD: Exudative ARMD with massive SRF -- mild interval improvement OS: nonexudative ARMD; ERM  Clinical management:  See below  Abbreviations: NFP - Normal foveal profile. CME - cystoid macular edema. PED - pigment epithelial detachment. IRF - intraretinal fluid. SRF - subretinal fluid. EZ - ellipsoid zone. ERM - epiretinal membrane. ORA - outer retinal atrophy. ORT - outer retinal tubulation. SRHM - subretinal hyper-reflective material         Intravitreal Injection, Pharmacologic Agent - OD - Right Eye     Time Out 10/12/2017. 12:15 PM. Confirmed correct patient, procedure, site, and patient consented.   Anesthesia Topical anesthesia was used. Anesthetic medications included Lidocaine 2%, Tetracaine 0.5%.   Procedure Preparation included 5% betadine to ocular surface, eyelid speculum. A 30 gauge needle was used.   Injection: 2 mg aflibercept 2 MG/0.05ML   NDC: 24401-027-25    Lot: [id number]    Expiration Date: 03/29/2018   Route: Intravitreal   Site: Right Eye   Waste: 0 mg  Post-op Post  injection exam found visual acuity of at least counting fingers. The patient tolerated the procedure well. There were no complications. The patient received written and verbal post procedure care education.                 ASSESSMENT/PLAN:    ICD-10-CM   1. Exudative age-related macular degeneration of right eye with active choroidal neovascularization (HCC) H35.3211 OCT, Retina - OU - Both Eyes    Intravitreal Injection, Pharmacologic Agent - OD - Right Eye    aflibercept (EYLEA) SOLN 2 mg  2. Intermediate stage nonexudative age-related macular degeneration of left eye H35.3122   3. Ocular hypertension, bilateral H40.053   4. Epiretinal membrane (ERM) of left eye H35.372   5. Pseudophakia of both eyes Z96.1     1. Exudative age related macular degeneration, right eye. The incidence pathology and anatomy of wet AMD discussed with patient including treatment options with intravitreal anti VEGF agents like Lucentis, Avastin and Eylea.  The ANCHOR, MARINA, CATT and VIEW trials discussed with patient.  Risks of endophthalmitis and vascular occlusive events and atrophic changes discussed with patient.  - at initial visit: massive IRF/SRF on OCT, VA CF OD; active CNVM with significant leakage   - s/p IVA OD #1 (8.20.18), #2 (9.18.18)   - s/p IVE OD #1 (10.16.18)  - today, OCT shows mild interval improvement in SRF but still with persistent SRF  - VA stable at 20/400 OD  - recommend IVE OD #2 today (11.13.18)  - pt signed Eylea 4 U paperwork -- pt enrolled in co-pay assistance with [employer]  - f/u 4 wks for OCT, IVA vs IVE OD  2. Age related macular degeneration,  non-exudative, left eye.   The incidence, anatomy, and pathology of dry AMD, risk of progression, and the AREDS and AREDS 2 study including smoking risks discussed with patient.  - monitor  3. Epiretinal membrane, left eye The natural history, anatomy, potential for loss of vision, and treatment options including  vitrectomy techniques and the complications of endophthalmitis, retinal detachment, vitreous hemorrhage, cataract progression and permanent vision loss discussed with the patient.  - ERM very mild with no symptoms  - monitor  4. Ocular hypertension OU  - IOP good today  - will continue to monitor for now with particular attention to IOP OD with intravitreal injections  5. Pseudophakia OU  - h/o of cataract surgery with Gershon Crane in 2013  - doing well  - monitor     Ophthalmic Meds Ordered this visit:  Meds ordered this encounter  Medications  . aflibercept (EYLEA) SOLN 2 mg       Return in about 4 weeks (around 11/09/2017) for F/U Exu ARMD OD .  There are no Patient Instructions on file for this visit.   Explained the diagnoses, plan, and follow up with the patient and they expressed understanding.  Patient expressed understanding of the importance of proper follow up care.   Gardiner Sleeper, M.D., Ph.D. Vitreoretinal Surgeon [facility] 10/13/17     Abbreviations: M myopia (nearsighted); A astigmatism; H hyperopia (farsighted); P presbyopia; Mrx spectacle prescription;  CTL contact lenses; OD right eye; OS left eye; OU both eyes  XT exotropia; ET esotropia; PEK punctate epithelial keratitis; PEE punctate epithelial erosions; DES dry eye syndrome; MGD meibomian gland dysfunction; ATs artificial tears; PFAT's preservative free artificial tears; [place] nuclear sclerotic cataract; PSC posterior subcapsular cataract; ERM epi-retinal membrane; PVD posterior vitreous detachment; RD retinal detachment; DM diabetes mellitus; DR diabetic retinopathy; NPDR non-proliferative diabetic retinopathy; PDR proliferative diabetic retinopathy; CSME clinically significant macular edema; DME diabetic macular edema; dbh dot blot hemorrhages; CWS cotton wool spot; POAG primary open angle glaucoma; C/D cup-to-disc ratio; HVF humphrey visual field; GVF goldmann visual field; OCT  optical coherence tomography; IOP intraocular pressure; BRVO Branch retinal vein occlusion; CRVO central retinal vein occlusion; CRAO central retinal artery occlusion; BRAO branch retinal artery occlusion; RT retinal tear; SB scleral buckle; PPV pars plana vitrectomy; VH Vitreous hemorrhage; PRP panretinal laser photocoagulation; IVK intravitreal kenalog; VMT vitreomacular traction; MH Macular hole;  NVD neovascularization of the disc; NVE neovascularization elsewhere; AREDS age related eye disease study; ARMD age related macular degeneration; POAG primary open angle glaucoma; EBMD epithelial/anterior basement membrane dystrophy; ACIOL anterior chamber intraocular lens; IOL intraocular lens; PCIOL posterior chamber intraocular lens; Phaco/IOL phacoemulsification with intraocular lens placement; [place] photorefractive keratectomy; LASIK laser assisted in situ keratomileusis; HTN hypertension; DM diabetes mellitus; COPD chronic obstructive pulmonary disease

## 2017-10-12 ENCOUNTER — Encounter (INDEPENDENT_AMBULATORY_CARE_PROVIDER_SITE_OTHER): Payer: Self-pay | Admitting: Ophthalmology

## 2017-10-12 ENCOUNTER — Ambulatory Visit (INDEPENDENT_AMBULATORY_CARE_PROVIDER_SITE_OTHER): Payer: PPO | Admitting: Ophthalmology

## 2017-10-12 DIAGNOSIS — H353122 Nonexudative age-related macular degeneration, left eye, intermediate dry stage: Secondary | ICD-10-CM

## 2017-10-12 DIAGNOSIS — H35372 Puckering of macula, left eye: Secondary | ICD-10-CM

## 2017-10-12 DIAGNOSIS — H353211 Exudative age-related macular degeneration, right eye, with active choroidal neovascularization: Secondary | ICD-10-CM

## 2017-10-12 DIAGNOSIS — H40053 Ocular hypertension, bilateral: Secondary | ICD-10-CM

## 2017-10-12 DIAGNOSIS — Z961 Presence of intraocular lens: Secondary | ICD-10-CM

## 2017-10-13 ENCOUNTER — Encounter (INDEPENDENT_AMBULATORY_CARE_PROVIDER_SITE_OTHER): Payer: Self-pay | Admitting: Ophthalmology

## 2017-10-13 DIAGNOSIS — H353211 Exudative age-related macular degeneration, right eye, with active choroidal neovascularization: Secondary | ICD-10-CM | POA: Diagnosis not present

## 2017-10-13 MED ORDER — AFLIBERCEPT 2MG/0.05ML IZ SOLN FOR KALEIDOSCOPE
2.0000 mg | INTRAVITREAL | Status: AC
  Administered 2017-10-13: 2 mg via INTRAVITREAL

## 2017-11-09 ENCOUNTER — Encounter (INDEPENDENT_AMBULATORY_CARE_PROVIDER_SITE_OTHER): Payer: PPO | Admitting: Ophthalmology

## 2017-11-10 ENCOUNTER — Encounter (INDEPENDENT_AMBULATORY_CARE_PROVIDER_SITE_OTHER): Payer: PPO | Admitting: Ophthalmology

## 2017-11-10 NOTE — Progress Notes (Deleted)
[facility] Note  11/10/2017     CHIEF COMPLAINT Patient presents for No chief complaint on file.   HISTORY OF PRESENT ILLNESS: Anna Mcconnell is a 83 y.o. female who presents to the clinic today for:    Pt reports history of arthritis and receives corticosteroid injections in her knee. Last injection was around May 2018 -- due for repeat injection soon. Denies any other steroid use.  Referring physician: Lemmie Evens, MD [place], [place] [address]  Shanon Brow, [address], [place] [address]   HISTORICAL INFORMATION:   Selected notes from the MEDICAL RECORD NUMBER Referral from Dr. Radford Pax for new-onset floaters and decreased vision OD - onset ~8.11.18  Prior ocular history: pseudophakia OU Gershon Crane, 2013); OHT/POAG suspect    CURRENT MEDICATIONS: No current outpatient medications on file. (Ophthalmic Drugs)   Current Facility-Administered Medications (Ophthalmic Drugs)  Medication Route   aflibercept (EYLEA) SOLN 2 mg Intravitreal   aflibercept (EYLEA) SOLN 2 mg Intravitreal   Current Outpatient Medications (Other)  Medication Sig   atenolol (TENORMIN) 50 MG tablet Take 50 mg by mouth daily.    hydrochlorothiazide (HYDRODIURIL) 25 MG tablet Take 25 mg by mouth daily.    ketoconazole (NIZORAL) 2 % cream Apply 1 application topically 2 (two) times daily. Continue for at least 48 hours after rash resolves.   LORazepam (ATIVAN) 1 MG tablet Take 1 mg by mouth daily as needed for anxiety. Only as needed     Not even every daily   Multiple Vitamins-Minerals (ICAPS AREDS FORMULA PO) Take by mouth.   naproxen (NAPROSYN) 500 MG tablet Take 500 mg by mouth 2 (two) times daily with a meal.   polyethylene glycol (MIRALAX / GLYCOLAX) packet Take 17 g by mouth daily.   traMADol (ULTRAM) 50 MG tablet Take 50 mg by mouth daily as needed (pain).    Vitamin D, Ergocalciferol, (DRISDOL) 50000 UNITS CAPS capsule  Take 50,000 Units by mouth every 7 (seven) days. Fridays   Current Facility-Administered Medications (Other)  Medication Route   Bevacizumab (AVASTIN) SOLN 1.25 mg Intravitreal   Bevacizumab (AVASTIN) SOLN 1.25 mg Intravitreal      REVIEW OF SYSTEMS:    ALLERGIES Allergies  Allergen Reactions   Psyllium Other (See Comments)    bloats    PAST MEDICAL HISTORY Past Medical History:  Diagnosis Date   Arthritis    HTN (hypertension)    Spinal stenosis    Varicose veins of left lower extremity with edema    Past Surgical History:  Procedure Laterality Date   CATARACT EXTRACTION Bilateral 2013   Dr. Gershon Crane   CHOLECYSTECTOMY     COLONOSCOPY  09/06/2006   [id number] rectum.  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  Sigmoid diverticula.  The remainder of the colonic mucosa normal   COLONOSCOPY N/A 09/25/2014   RMR:No rectal mass on digital rectal exam or with colonoscopic visualization. Internal hemorrhoids. Poor prep precluded complete examination, limited to sigmoid colon.   COLONOSCOPY N/A 04/11/2015   TFT:[id number] diverticulosis   FRACTURE SURGERY Right    ankle   hysterectomy     varicose vein ligation      FAMILY HISTORY Family History  Problem Relation Age of Onset   Tuberculosis Mother        deceased at young age   Tuberculosis Father        deceased at young age   Colon cancer Neg Hx  Amblyopia Neg Hx    Cataracts Neg Hx    Glaucoma Neg Hx    Macular degeneration Neg Hx    Retinal detachment Neg Hx    Strabismus Neg Hx    Retinitis pigmentosa Neg Hx     SOCIAL HISTORY Social History   Tobacco Use   Smoking status: Former Smoker    Last attempt to quit: 07/20/2007    Years since quitting: 10.3   Smokeless tobacco: Never Used   Tobacco comment: Quit smoking x 25 years  Substance Use Topics   Alcohol use: Not on file   Drug use: Not on file         OPHTHALMIC EXAM:   Not recorded       IMAGING AND PROCEDURES  Imaging and Procedures for 11/10/17           ASSESSMENT/PLAN:    ICD-10-CM   1. Exudative age-related macular degeneration of right eye with active choroidal neovascularization (HCC) H35.3211 OCT, Retina - OU - Both Eyes  2. Intermediate stage nonexudative age-related macular degeneration of left eye H35.3122   3. Ocular hypertension, bilateral H40.053   4. Epiretinal membrane (ERM) of left eye H35.372   5. Pseudophakia of both eyes Z96.1     1. Exudative age related macular degeneration, right eye. The incidence pathology and anatomy of wet AMD discussed with patient including treatment options with intravitreal anti VEGF agents like Lucentis, Avastin and Eylea.  The ANCHOR, MARINA, CATT and VIEW trials discussed with patient.  Risks of endophthalmitis and vascular occlusive events and atrophic changes discussed with patient.  - at initial visit: massive IRF/SRF on OCT, VA CF OD; active CNVM with significant leakage   - s/p IVA OD #1 (8.20.18), #2 (9.18.18)   - s/p IVE OD #1 (10.16.18), #2 (11.13.18)  - today, OCT shows mild interval improvement in SRF but still with persistent SRF  - VA stable at 20/400 OD  - recommend IV*** OD #3  - pt signed Eylea 4 U paperwork -- pt enrolled in co-pay assistance with [employer]  - f/u 4 wks for OCT, IVA vs IVE OD  2. Age related macular degeneration, non-exudative, left eye.   The incidence, anatomy, and pathology of dry AMD, risk of progression, and the AREDS and AREDS 2 study including smoking risks discussed with patient.  - monitor  3. Epiretinal membrane, left eye The natural history, anatomy, potential for loss of vision, and treatment options including vitrectomy techniques and the complications of endophthalmitis, retinal detachment, vitreous hemorrhage, cataract progression and permanent vision loss discussed with the patient.  - ERM very mild with no symptoms  - monitor  4. Ocular  hypertension OU  - IOP good today  - will continue to monitor for now with particular attention to IOP OD with intravitreal injections  5. Pseudophakia OU  - h/o of cataract surgery with Gershon Crane in 2013  - doing well  - monitor     Ophthalmic Meds Ordered this visit:  No orders of the defined types were placed in this encounter.      No Follow-up on file.  There are no Patient Instructions on file for this visit.   Explained the diagnoses, plan, and follow up with the patient and they expressed understanding.  Patient expressed understanding of the importance of proper follow up care.   This document serves as a record of services personally performed by Gardiner Sleeper, MD, PhD. It was created on their behalf by Ailene Ravel  Erline Levine, a certified ophthalmic assistant. The creation of this record is the provider's dictation and/or activities during the visit.  Electronically signed by: Catha Brow, COA  11/10/17 8:29 AM    Gardiner Sleeper, M.D., Ph.D. Vitreoretinal Surgeon [facility] 11/10/17     Abbreviations: M myopia (nearsighted); A astigmatism; H hyperopia (farsighted); P presbyopia; Mrx spectacle prescription;  CTL contact lenses; OD right eye; OS left eye; OU both eyes  XT exotropia; ET esotropia; PEK punctate epithelial keratitis; PEE punctate epithelial erosions; DES dry eye syndrome; MGD meibomian gland dysfunction; ATs artificial tears; PFAT's preservative free artificial tears; [place] nuclear sclerotic cataract; PSC posterior subcapsular cataract; ERM epi-retinal membrane; PVD posterior vitreous detachment; RD retinal detachment; DM diabetes mellitus; DR diabetic retinopathy; NPDR non-proliferative diabetic retinopathy; PDR proliferative diabetic retinopathy; CSME clinically significant macular edema; DME diabetic macular edema; dbh dot blot hemorrhages; CWS cotton wool spot; POAG primary open angle glaucoma; C/D cup-to-disc ratio; HVF  humphrey visual field; GVF goldmann visual field; OCT optical coherence tomography; IOP intraocular pressure; BRVO Branch retinal vein occlusion; CRVO central retinal vein occlusion; CRAO central retinal artery occlusion; BRAO branch retinal artery occlusion; RT retinal tear; SB scleral buckle; PPV pars plana vitrectomy; VH Vitreous hemorrhage; PRP panretinal laser photocoagulation; IVK intravitreal kenalog; VMT vitreomacular traction; MH Macular hole;  NVD neovascularization of the disc; NVE neovascularization elsewhere; AREDS age related eye disease study; ARMD age related macular degeneration; POAG primary open angle glaucoma; EBMD epithelial/anterior basement membrane dystrophy; ACIOL anterior chamber intraocular lens; IOL intraocular lens; PCIOL posterior chamber intraocular lens; Phaco/IOL phacoemulsification with intraocular lens placement; [place] photorefractive keratectomy; LASIK laser assisted in situ keratomileusis; HTN hypertension; DM diabetes mellitus; COPD chronic obstructive pulmonary disease

## 2017-11-11 NOTE — Progress Notes (Signed)
[facility] Note  11/12/2017     CHIEF COMPLAINT Patient presents for Retina Follow Up   HISTORY OF PRESENT ILLNESS: Anna Mcconnell is a 83 y.o. female who presents to the clinic today for:   HPI    Retina Follow Up    Patient presents with  Wet AMD.  In right eye.  Severity is moderate.  Duration of 1 month.  Since onset it is stable.  I, the attending physician,  performed the HPI with the patient and updated documentation appropriately.          Comments    Pt presents today for follow up of exudative AMD, pt states her vision is stable since last visit, she denies flashes, floaters, wavy vision and pain, states she is prepared for another injection today       Last edited by Bernarda Caffey, MD on 11/12/2017  2:35 PM. (History)     Pt reports she has not noticed any change in OU VA since last visit.  Referring physician: Lemmie Evens, MD [place], [place] [address]  Shanon Brow, [address], [place] [address]   HISTORICAL INFORMATION:   Selected notes from the MEDICAL RECORD NUMBER Referral from Dr. Radford Pax for new-onset floaters and decreased vision OD - onset ~8.11.18  Prior ocular history: pseudophakia OU Gershon Crane, 2013); OHT/POAG suspect    CURRENT MEDICATIONS: No current outpatient medications on file. (Ophthalmic Drugs)   Current Facility-Administered Medications (Ophthalmic Drugs)  Medication Route  . aflibercept (EYLEA) SOLN 2 mg Intravitreal  . aflibercept (EYLEA) SOLN 2 mg Intravitreal  . aflibercept (EYLEA) SOLN 2 mg Intravitreal   Current Outpatient Medications (Other)  Medication Sig  . atenolol (TENORMIN) 50 MG tablet Take 50 mg by mouth daily.   . hydrochlorothiazide (HYDRODIURIL) 25 MG tablet Take 25 mg by mouth daily.   Marland Kitchen ketoconazole (NIZORAL) 2 % cream Apply 1 application topically 2 (two) times daily. Continue for at least 48 hours after rash resolves.  Marland Kitchen LORazepam (ATIVAN) 1 MG  tablet Take 1 mg by mouth daily as needed for anxiety. Only as needed     Not even every daily  . Multiple Vitamins-Minerals (ICAPS AREDS FORMULA PO) Take by mouth.  . naproxen (NAPROSYN) 500 MG tablet Take 500 mg by mouth 2 (two) times daily with a meal.  . polyethylene glycol (MIRALAX / GLYCOLAX) packet Take 17 g by mouth daily.  . traMADol (ULTRAM) 50 MG tablet Take 50 mg by mouth daily as needed (pain).   . Vitamin D, Ergocalciferol, (DRISDOL) 50000 UNITS CAPS capsule Take 50,000 Units by mouth every 7 (seven) days. Fridays   Current Facility-Administered Medications (Other)  Medication Route  . Bevacizumab (AVASTIN) SOLN 1.25 mg Intravitreal  . Bevacizumab (AVASTIN) SOLN 1.25 mg Intravitreal      REVIEW OF SYSTEMS: ROS    Positive for: Eyes   Negative for: Constitutional, Gastrointestinal, Neurological, Skin, Genitourinary, Musculoskeletal, HENT, Endocrine, Cardiovascular, Respiratory, Psychiatric, Allergic/Imm, Heme/Lymph   Last edited by Debbrah Alar, COT on 11/12/2017  1:36 PM. (History)       ALLERGIES Allergies  Allergen Reactions  . Psyllium Other (See Comments)    bloats    PAST MEDICAL HISTORY Past Medical History:  Diagnosis Date  . Arthritis   . HTN (hypertension)   . Spinal stenosis   . Varicose veins of left lower extremity with edema    Past Surgical History:  Procedure Laterality Date  .  CATARACT EXTRACTION Bilateral 2013   Dr. Gershon Crane  . CHOLECYSTECTOMY    . COLONOSCOPY  09/06/2006   ZOX:WRUEAV rectum.  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  Sigmoid diverticula.  The remainder of the colonic mucosa normal  . COLONOSCOPY N/A 09/25/2014   RMR:No rectal mass on digital rectal exam or with colonoscopic visualization. Internal hemorrhoids. Poor prep precluded complete examination, limited to sigmoid colon.  . COLONOSCOPY N/A 04/11/2015   WUJ:[id number] diverticulosis  . FRACTURE SURGERY Right    ankle  . hysterectomy    .  varicose vein ligation      FAMILY HISTORY Family History  Problem Relation Age of Onset  . Tuberculosis Mother        deceased at young age  . Tuberculosis Father        deceased at young age  . Colon cancer Neg Hx   . Amblyopia Neg Hx   . Cataracts Neg Hx   . Glaucoma Neg Hx   . Macular degeneration Neg Hx   . Retinal detachment Neg Hx   . Strabismus Neg Hx   . Retinitis pigmentosa Neg Hx     SOCIAL HISTORY Social History   Tobacco Use  . Smoking status: Former Smoker    Last attempt to quit: 07/20/2007    Years since quitting: 10.3  . Smokeless tobacco: Never Used  . Tobacco comment: Quit smoking x 25 years  Substance Use Topics  . Alcohol use: Not on file  . Drug use: Not on file         OPHTHALMIC EXAM:  Base Eye Exam    Visual Acuity (Snellen - Linear)      Right Left   Dist [place] 20/400 20/25   Dist ph [place] 20/250 -1 NI   Correction:  Glasses       Tonometry (Tonopen, 1:47 PM)      Right Left   Pressure 22 26       Tonometry #2 (Tonopen, 1:47 PM)      Right Left   Pressure 22 24       Pupils      Dark Light Shape React APD   Right 3 1 Round Brisk None   Left 3 1 Round Brisk None       Visual Fields      Left Right    Full    Restrictions  Central scotoma       Extraocular Movement      Right Left    Full, Ortho Full, Ortho       Neuro/Psych    Oriented x3:  Yes   Mood/Affect:  Normal       Dilation    Both eyes:  1.0% Mydriacyl, 2.5% Phenylephrine @ 1:47 PM        Slit Lamp and Fundus Exam    External Exam      Right Left   External Normal Normal       Slit Lamp Exam      Right Left   Lids/Lashes Dermatochalasis - upper lid Dermatochalasis - upper lid   Conjunctiva/Sclera Pinguecula Pinguecula   Cornea early band K  at 3 o'clock and at 9 o'clock early band K  at 3 o'clock and at 9 o'clock   Anterior Chamber Deep and quiet Deep and quiet   Iris Round and reactive; dilated Round and reactive; dilated   Lens Posterior  chamber intraocular lens, Posterior capsular opacification - mild;  Posterior chamber intraocular lens, Posterior  capsular opacification - mild;    Vitreous Vitreous syneresis; PVD Vitreous syneresis; PVD       Fundus Exam      Right Left   Disc Normal Normal   C/D Ratio 0.3 0.3   Macula persistent edema / SRF w/ exudation; PED / CNVM within SRF; macular pucker; no heme Flat with RPE changes, druplets; trace ERM   Vessels Normal Normal   Periphery Flat; reticlaur degeneration flat; reticular degeneration          IMAGING AND PROCEDURES  Imaging and Procedures for 11/12/17  OCT, Retina - OU - Both Eyes     Right Eye Quality was good. Central Foveal Thickness: 629. Progression has improved. Findings include abnormal foveal contour, choroidal neovascular membrane, pigment epithelial detachment, subretinal fluid.   Left Eye Quality was good. Central Foveal Thickness: 360. Progression has been stable. Findings include abnormal foveal contour, epiretinal membrane, retinal drusen , no IRF, no SRF.   Notes Images taken, stored on drive  Diagnosis / Impression:  OD: Exudative ARMD with massive SRF -- mild interval improvement OS: nonexudative ARMD; ERM  Clinical management:  See below  Abbreviations: NFP - Normal foveal profile. CME - cystoid macular edema. PED - pigment epithelial detachment. IRF - intraretinal fluid. SRF - subretinal fluid. EZ - ellipsoid zone. ERM - epiretinal membrane. ORA - outer retinal atrophy. ORT - outer retinal tubulation. SRHM - subretinal hyper-reflective material         Intravitreal Injection, Pharmacologic Agent - OD - Right Eye     Time Out 11/12/2017. 2:26 PM. Confirmed correct patient, procedure, site, and patient consented.   Anesthesia Topical anesthesia was used. Anesthetic medications included Lidocaine 2%, Tetracaine 0.5%.   Procedure Preparation included 5% betadine to ocular surface, eyelid speculum. A supplied needle was used.    Injection: 2 mg aflibercept (EYLEA) SOLN 2 mg   NDC: 83382-505-39    Lot: [id number]    Expiration Date: 04/29/2018   Route: Intravitreal   Site: Right Eye   Waste: 0 mg  Post-op Post injection exam found visual acuity of at least counting fingers. The patient tolerated the procedure well. There were no complications. The patient received written and verbal post procedure care education.                 ASSESSMENT/PLAN:    ICD-10-CM   1. Exudative age-related macular degeneration of right eye with active choroidal neovascularization (HCC) H35.3211 OCT, Retina - OU - Both Eyes    Intravitreal Injection, Pharmacologic Agent - OD - Right Eye    aflibercept (EYLEA) SOLN 2 mg  2. Intermediate stage nonexudative age-related macular degeneration of left eye H35.3122   3. Ocular hypertension, bilateral H40.053   4. Epiretinal membrane (ERM) of left eye H35.372   5. Pseudophakia of both eyes Z96.1     1. Exudative age related macular degeneration, right eye. The incidence pathology and anatomy of wet AMD discussed with patient including treatment options with intravitreal anti VEGF agents like Lucentis, Avastin and Eylea.  The ANCHOR, MARINA, CATT and VIEW trials discussed with patient.  Risks of endophthalmitis and vascular occlusive events and atrophic changes discussed with patient.  - at initial visit: massive IRF/SRF on OCT, VA CF OD; active CNVM with significant leakage   - s/p IVA OD #1 (8.20.18), #2 (9.18.18)   - s/p IVE OD #1 (10.16.18), #2 (11.13.18)  - today, OCT shows mild interval improvement in SRF but still with persistent SRF  - VA improved  slightly to 20/250 ph OD  - recommend IVE OD #3 (12.14.18)  - pt signed Eylea 4 U paperwork -- pt enrolled in co-pay assistance with [employer], will need to renew for 2019  - f/u 4 wks for OCT, possible injection OD  2. Age related macular degeneration, non-exudative, left eye.   The incidence, anatomy, and pathology  of dry AMD, risk of progression, and the AREDS and AREDS 2 study including smoking risks discussed with patient.  - monitor  3. Epiretinal membrane, left eye The natural history, anatomy, potential for loss of vision, and treatment options including vitrectomy techniques and the complications of endophthalmitis, retinal detachment, vitreous hemorrhage, cataract progression and permanent vision loss discussed with the patient.  - ERM very mild with no symptoms  - monitor  4. Ocular hypertension OU  - IOP mildly elevated OU today  - will continue to monitor for now with particular attention to IOP OD with intravitreal injections  5. Pseudophakia OU  - h/o of cataract surgery with Gershon Crane in 2013  - doing well  - monitor     Ophthalmic Meds Ordered this visit:  Meds ordered this encounter  Medications  . aflibercept (EYLEA) SOLN 2 mg       No Follow-up on file.  There are no Patient Instructions on file for this visit.   Explained the diagnoses, plan, and follow up with the patient and they expressed understanding.  Patient expressed understanding of the importance of proper follow up care.   This document serves as a record of services personally performed by Gardiner Sleeper, MD, PhD. It was created on their behalf by Catha Brow, [place], a certified ophthalmic assistant. The creation of this record is the provider's dictation and/or activities during the visit.  Electronically signed by: Catha Brow, COA  11/12/17 3:58 PM    Gardiner Sleeper, M.D., Ph.D. Vitreoretinal Surgeon [facility] 11/12/17     Abbreviations: M myopia (nearsighted); A astigmatism; H hyperopia (farsighted); P presbyopia; Mrx spectacle prescription;  CTL contact lenses; OD right eye; OS left eye; OU both eyes  XT exotropia; ET esotropia; PEK punctate epithelial keratitis; PEE punctate epithelial erosions; DES dry eye syndrome; MGD meibomian gland dysfunction; ATs  artificial tears; PFAT's preservative free artificial tears; [place] nuclear sclerotic cataract; PSC posterior subcapsular cataract; ERM epi-retinal membrane; PVD posterior vitreous detachment; RD retinal detachment; DM diabetes mellitus; DR diabetic retinopathy; NPDR non-proliferative diabetic retinopathy; PDR proliferative diabetic retinopathy; CSME clinically significant macular edema; DME diabetic macular edema; dbh dot blot hemorrhages; CWS cotton wool spot; POAG primary open angle glaucoma; C/D cup-to-disc ratio; HVF humphrey visual field; GVF goldmann visual field; OCT optical coherence tomography; IOP intraocular pressure; BRVO Branch retinal vein occlusion; CRVO central retinal vein occlusion; CRAO central retinal artery occlusion; BRAO branch retinal artery occlusion; RT retinal tear; SB scleral buckle; PPV pars plana vitrectomy; VH Vitreous hemorrhage; PRP panretinal laser photocoagulation; IVK intravitreal kenalog; VMT vitreomacular traction; MH Macular hole;  NVD neovascularization of the disc; NVE neovascularization elsewhere; AREDS age related eye disease study; ARMD age related macular degeneration; POAG primary open angle glaucoma; EBMD epithelial/anterior basement membrane dystrophy; ACIOL anterior chamber intraocular lens; IOL intraocular lens; PCIOL posterior chamber intraocular lens; Phaco/IOL phacoemulsification with intraocular lens placement; [place] photorefractive keratectomy; LASIK laser assisted in situ keratomileusis; HTN hypertension; DM diabetes mellitus; COPD chronic obstructive pulmonary disease

## 2017-11-12 ENCOUNTER — Encounter (INDEPENDENT_AMBULATORY_CARE_PROVIDER_SITE_OTHER): Payer: Self-pay | Admitting: Ophthalmology

## 2017-11-12 ENCOUNTER — Ambulatory Visit (INDEPENDENT_AMBULATORY_CARE_PROVIDER_SITE_OTHER): Payer: PPO | Admitting: Ophthalmology

## 2017-11-12 DIAGNOSIS — H353122 Nonexudative age-related macular degeneration, left eye, intermediate dry stage: Secondary | ICD-10-CM

## 2017-11-12 DIAGNOSIS — H35372 Puckering of macula, left eye: Secondary | ICD-10-CM

## 2017-11-12 DIAGNOSIS — H353211 Exudative age-related macular degeneration, right eye, with active choroidal neovascularization: Secondary | ICD-10-CM

## 2017-11-12 DIAGNOSIS — H40053 Ocular hypertension, bilateral: Secondary | ICD-10-CM

## 2017-11-12 DIAGNOSIS — Z961 Presence of intraocular lens: Secondary | ICD-10-CM

## 2017-11-12 MED ORDER — AFLIBERCEPT 2MG/0.05ML IZ SOLN FOR KALEIDOSCOPE
2.0000 mg | INTRAVITREAL | Status: AC
  Administered 2017-11-12: 2 mg via INTRAVITREAL

## 2017-12-20 NOTE — Progress Notes (Signed)
[facility] Note  12/21/2017     CHIEF COMPLAINT Patient presents for Retina Follow Up   HISTORY OF PRESENT ILLNESS: Anna Mcconnell is a 84 y.o. female who presents to the clinic today for:   HPI    Retina Follow Up    Patient presents with  Wet AMD.  In both eyes.  Severity is moderate.  Duration of 5.  Since onset it is stable.  I, the attending physician,  performed the HPI with the patient and updated documentation appropriately.          Comments    Pt presents today for F/U Exu AMD OD, and non-exu AMD OS, pt states vision is stable, but states she can see better in the morning than she can at night, pt denies flashes, floaters, wavy vision or pain, pt states she uses gtts PRN as needed for dryness and uses AREDS vitamins twice a day, pt is not diabetic,        Last edited by Bernarda Caffey, MD on 12/21/2017  2:34 PM. (History)     Pt reports she has not noticed any change in OU VA since last visit.  Referring physician: Lemmie Evens, MD [address], [place] [address]  [address], [place] [address] [place],  [address]   HISTORICAL INFORMATION:   Selected notes from the MEDICAL RECORD NUMBER Referral from Dr. Radford Pax for new-onset floaters and decreased vision OD - onset ~8.11.18  Prior ocular history: pseudophakia OU Gershon Crane, 2013); OHT/POAG suspect    CURRENT MEDICATIONS: No current outpatient medications on file. (Ophthalmic Drugs)   Current Facility-Administered Medications (Ophthalmic Drugs)  Medication Route  . aflibercept (EYLEA) SOLN 2 mg Intravitreal  . aflibercept (EYLEA) SOLN 2 mg Intravitreal  . aflibercept (EYLEA) SOLN 2 mg Intravitreal  . aflibercept (EYLEA) SOLN 2 mg Intravitreal   Current Outpatient Medications (Other)  Medication Sig  . atenolol (TENORMIN) 50 MG tablet Take 50 mg by mouth daily.   . hydrochlorothiazide (HYDRODIURIL) 25 MG tablet Take 25 mg by mouth daily.   Marland Kitchen ketoconazole  (NIZORAL) 2 % cream Apply 1 application topically 2 (two) times daily. Continue for at least 48 hours after rash resolves.  Marland Kitchen LORazepam (ATIVAN) 1 MG tablet Take 1 mg by mouth daily as needed for anxiety. Only as needed     Not even every daily  . Multiple Vitamins-Minerals (ICAPS AREDS FORMULA PO) Take by mouth.  . naproxen (NAPROSYN) 500 MG tablet Take 500 mg by mouth 2 (two) times daily with a meal.  . polyethylene glycol (MIRALAX / GLYCOLAX) packet Take 17 g by mouth daily.  . traMADol (ULTRAM) 50 MG tablet Take 50 mg by mouth daily as needed (pain).   . Vitamin D, Ergocalciferol, (DRISDOL) 50000 UNITS CAPS capsule Take 50,000 Units by mouth every 7 (seven) days. Fridays   Current Facility-Administered Medications (Other)  Medication Route  . Bevacizumab (AVASTIN) SOLN 1.25 mg Intravitreal  . Bevacizumab (AVASTIN) SOLN 1.25 mg Intravitreal      REVIEW OF SYSTEMS: ROS    Positive for: Eyes   Negative for: Constitutional, Gastrointestinal, Neurological, Skin, Genitourinary, Musculoskeletal, HENT, Endocrine, Cardiovascular, Respiratory, Psychiatric, Allergic/Imm, Heme/Lymph   Last edited by Debbrah Alar, COT on 12/21/2017  1:29 PM. (History)       ALLERGIES Allergies  Allergen Reactions  . Psyllium Other (See Comments)    bloats    PAST MEDICAL HISTORY Past Medical History:  Diagnosis Date  .  Arthritis   . HTN (hypertension)   . Spinal stenosis   . Varicose veins of left lower extremity with edema    Past Surgical History:  Procedure Laterality Date  . CATARACT EXTRACTION Bilateral 2013   Dr. Gershon Crane  . CHOLECYSTECTOMY    . COLONOSCOPY  09/06/2006   KWI:OXBDZH rectum.  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  Sigmoid diverticula.  The remainder of the colonic mucosa normal  . COLONOSCOPY N/A 09/25/2014   RMR:No rectal mass on digital rectal exam or with colonoscopic visualization. Internal hemorrhoids. Poor prep precluded complete examination,  limited to sigmoid colon.  . COLONOSCOPY N/A 04/11/2015   GDJ:MEQASTM diverticulosis  . FRACTURE SURGERY Right    ankle  . hysterectomy    . varicose vein ligation      FAMILY HISTORY Family History  Problem Relation Age of Onset  . Tuberculosis Mother        deceased at young age  . Tuberculosis Father        deceased at young age  . Colon cancer Neg Hx   . Amblyopia Neg Hx   . Cataracts Neg Hx   . Glaucoma Neg Hx   . Macular degeneration Neg Hx   . Retinal detachment Neg Hx   . Strabismus Neg Hx   . Retinitis pigmentosa Neg Hx     SOCIAL HISTORY Social History   Tobacco Use  . Smoking status: Former Smoker    Last attempt to quit: 07/20/2007    Years since quitting: 10.4  . Smokeless tobacco: Never Used  . Tobacco comment: Quit smoking x 25 years  Substance Use Topics  . Alcohol use: Not on file  . Drug use: Not on file         OPHTHALMIC EXAM:  Base Eye Exam    Visual Acuity (Snellen - Linear)      Right Left   Dist cc CF at 3' 20/25 +2   Dist ph cc NI NI   Correction:  Glasses       Tonometry (Tonopen, 1:39 PM)      Right Left   Pressure 15 18       Pupils      Dark Light Shape React APD   Right 3 1 Round Brisk None   Left 3 1 Round Brisk None       Visual Fields      Left Right   Restrictions  Partial inner superior temporal, inferior temporal, superior nasal, inferior nasal deficiencies       Extraocular Movement      Right Left    Full, Ortho Full, Ortho       Neuro/Psych    Oriented x3:  Yes   Mood/Affect:  Normal       Dilation    Both eyes:  1.0% Mydriacyl, 2.5% Phenylephrine @ 1:39 PM        Slit Lamp and Fundus Exam    External Exam      Right Left   External Normal Normal       Slit Lamp Exam      Right Left   Lids/Lashes Dermatochalasis - upper lid Dermatochalasis - upper lid   Conjunctiva/Sclera Pinguecula Pinguecula   Cornea early band K  at 3 o'clock and at 9 o'clock early band K  at 3 o'clock and at 9  o'clock   Anterior Chamber Deep and quiet Deep and quiet   Iris Round and reactive; dilated Round and reactive; dilated  Lens Posterior chamber intraocular lens, Posterior capsular opacification - mild;  Posterior chamber intraocular lens, Posterior capsular opacification - mild;    Vitreous Vitreous syneresis; PVD Vitreous syneresis; PVD       Fundus Exam      Right Left   Disc Normal Normal   C/D Ratio 0.3 0.3   Macula persistent edema / SRF w/ exudation; PED / CNVM within SRF; macular pucker; no heme, interval increase in SRF Flat with RPE changes, druplets; trace ERM   Vessels Normal Normal   Periphery Flat; reticlaur degeneration flat; reticular degeneration          IMAGING AND PROCEDURES  Imaging and Procedures for 12/21/17  OCT, Retina - OU - Both Eyes     Right Eye Quality was good. Central Foveal Thickness: 637. Progression has worsened. Findings include abnormal foveal contour, choroidal neovascular membrane, pigment epithelial detachment, subretinal fluid.   Left Eye Quality was poor. Central Foveal Thickness: 371. Progression has been stable. Findings include abnormal foveal contour, epiretinal membrane, retinal drusen , no IRF, no SRF.   Notes Images taken, stored on drive  Diagnosis / Impression:  OD: Exudative ARMD with SRF -- interval worsening OS: nonexudative ARMD; ERM  Clinical management:  See below  Abbreviations: NFP - Normal foveal profile. CME - cystoid macular edema. PED - pigment epithelial detachment. IRF - intraretinal fluid. SRF - subretinal fluid. EZ - ellipsoid zone. ERM - epiretinal membrane. ORA - outer retinal atrophy. ORT - outer retinal tubulation. SRHM - subretinal hyper-reflective material         Intravitreal Injection, Pharmacologic Agent - OD - Right Eye     Time Out 12/21/2017. 3:06 PM. Confirmed correct patient, procedure, site, and patient consented.   Anesthesia Topical anesthesia was used. Anesthetic medications  included Lidocaine 2%.   Procedure Preparation included 5% betadine to ocular surface, eyelid speculum. A 30 gauge needle was used.   Injection: 2 mg aflibercept 2 MG/0.05ML   NDC: 71696-789-38    Lot: [id number]    Expiration Date: 06/29/2018   Route: Intravitreal   Site: Right Eye   Waste: 0 mg  Post-op Post injection exam found visual acuity of at least counting fingers. The patient tolerated the procedure well. There were no complications. The patient received written and verbal post procedure care education.                 ASSESSMENT/PLAN:    ICD-10-CM   1. Exudative age-related macular degeneration of right eye with active choroidal neovascularization (HCC) H35.3211 OCT, Retina - OU - Both Eyes    Intravitreal Injection, Pharmacologic Agent - OD - Right Eye    aflibercept (EYLEA) SOLN 2 mg  2. Intermediate stage nonexudative age-related macular degeneration of left eye H35.3122   3. Ocular hypertension, bilateral H40.053   4. Epiretinal membrane (ERM) of left eye H35.372   5. Pseudophakia of both eyes Z96.1     1. Exudative age related macular degeneration, right eye. The incidence pathology and anatomy of wet AMD discussed with patient including treatment options with intravitreal anti VEGF agents like Lucentis, Avastin and Eylea.  The ANCHOR, MARINA, CATT and VIEW trials discussed with patient.  Risks of endophthalmitis and vascular occlusive events and atrophic changes discussed with patient.  - at initial visit: massive IRF/SRF on OCT, VA CF OD; active CNVM with significant leakage   - s/p IVA OD #1 (8.20.18), #2 (9.18.18)   - s/p IVE OD #1 (10.16.18), #2 (11.13.18), #3 (12.14.18) -- 39  day interval   - today, OCT shows interval worsening in SRF, likely due to delayed follow up  - VA decreased to CF at 3' OD  - recommend IVE OD #4 today (01.22.19) with tighter follow up, if possible  - f/u 4 wks for OCT, possible injection OD  2. Age related macular  degeneration, non-exudative, left eye.   The incidence, anatomy, and pathology of dry AMD, risk of progression, and the AREDS and AREDS 2 study including smoking risks discussed with patient.  - monitor  3. Epiretinal membrane, left eye The natural history, anatomy, potential for loss of vision, and treatment options including vitrectomy techniques and the complications of endophthalmitis, retinal detachment, vitreous hemorrhage, cataract progression and permanent vision loss discussed with the patient.  - ERM very mild with no symptoms  - monitor  4. Ocular hypertension OU  - IOP ok today OU  - will continue to monitor for now with particular attention to IOP OD with intravitreal injections  5. Pseudophakia OU  - h/o of cataract surgery with Gershon Crane in 2013  - doing well  - monitor     Ophthalmic Meds Ordered this visit:  Meds ordered this encounter  Medications  . aflibercept (EYLEA) SOLN 2 mg       Return in about 4 weeks (around 01/18/2018) for Dilated Exam, OCT, Possible Injxn.  There are no Patient Instructions on file for this visit.   Explained the diagnoses, plan, and follow up with the patient and they expressed understanding.  Patient expressed understanding of the importance of proper follow up care.   This document serves as a record of services personally performed by Gardiner Sleeper, MD, PhD. It was created on their behalf by Catha Brow, [place], a certified ophthalmic assistant. The creation of this record is the provider's dictation and/or activities during the visit.  Electronically signed by: Catha Brow, [place]  12/21/17 3:32 PM    Gardiner Sleeper, M.D., Ph.D. Vitreoretinal Surgeon [facility] 12/21/17   I have reviewed the above documentation for accuracy and completeness, and I agree with the above. Gardiner Sleeper, M.D., Ph.D. 12/21/17 3:32 PM     Abbreviations: M myopia (nearsighted); A astigmatism; H hyperopia  (farsighted); P presbyopia; Mrx spectacle prescription;  CTL contact lenses; OD right eye; OS left eye; OU both eyes  XT exotropia; ET esotropia; PEK punctate epithelial keratitis; PEE punctate epithelial erosions; DES dry eye syndrome; MGD meibomian gland dysfunction; ATs artificial tears; PFAT's preservative free artificial tears; [place] nuclear sclerotic cataract; PSC posterior subcapsular cataract; ERM epi-retinal membrane; PVD posterior vitreous detachment; RD retinal detachment; DM diabetes mellitus; DR diabetic retinopathy; NPDR non-proliferative diabetic retinopathy; PDR proliferative diabetic retinopathy; CSME clinically significant macular edema; DME diabetic macular edema; dbh dot blot hemorrhages; CWS cotton wool spot; POAG primary open angle glaucoma; C/D cup-to-disc ratio; HVF humphrey visual field; GVF goldmann visual field; OCT optical coherence tomography; IOP intraocular pressure; BRVO Branch retinal vein occlusion; CRVO central retinal vein occlusion; CRAO central retinal artery occlusion; BRAO branch retinal artery occlusion; RT retinal tear; SB scleral buckle; PPV pars plana vitrectomy; VH Vitreous hemorrhage; PRP panretinal laser photocoagulation; IVK intravitreal kenalog; VMT vitreomacular traction; MH Macular hole;  NVD neovascularization of the disc; NVE neovascularization elsewhere; AREDS age related eye disease study; ARMD age related macular degeneration; POAG primary open angle glaucoma; EBMD epithelial/anterior basement membrane dystrophy; ACIOL anterior chamber intraocular lens; IOL intraocular lens; PCIOL posterior chamber intraocular lens; Phaco/IOL phacoemulsification with intraocular lens placement; [place]  photorefractive keratectomy; LASIK laser assisted in situ keratomileusis; HTN hypertension; DM diabetes mellitus; COPD chronic obstructive pulmonary disease

## 2017-12-21 ENCOUNTER — Ambulatory Visit (INDEPENDENT_AMBULATORY_CARE_PROVIDER_SITE_OTHER): Payer: PPO | Admitting: Ophthalmology

## 2017-12-21 ENCOUNTER — Encounter (INDEPENDENT_AMBULATORY_CARE_PROVIDER_SITE_OTHER): Payer: Self-pay | Admitting: Ophthalmology

## 2017-12-21 DIAGNOSIS — Z961 Presence of intraocular lens: Secondary | ICD-10-CM | POA: Diagnosis not present

## 2017-12-21 DIAGNOSIS — H353211 Exudative age-related macular degeneration, right eye, with active choroidal neovascularization: Secondary | ICD-10-CM | POA: Diagnosis not present

## 2017-12-21 DIAGNOSIS — H35372 Puckering of macula, left eye: Secondary | ICD-10-CM | POA: Diagnosis not present

## 2017-12-21 DIAGNOSIS — H353122 Nonexudative age-related macular degeneration, left eye, intermediate dry stage: Secondary | ICD-10-CM

## 2017-12-21 DIAGNOSIS — H40053 Ocular hypertension, bilateral: Secondary | ICD-10-CM | POA: Diagnosis not present

## 2017-12-21 MED ORDER — AFLIBERCEPT 2MG/0.05ML IZ SOLN FOR KALEIDOSCOPE
2.0000 mg | INTRAVITREAL | Status: AC
  Administered 2017-12-21: 2 mg via INTRAVITREAL

## 2017-12-31 ENCOUNTER — Encounter: Payer: Self-pay | Admitting: Orthopedic Surgery

## 2017-12-31 ENCOUNTER — Ambulatory Visit: Payer: PPO | Admitting: Orthopedic Surgery

## 2017-12-31 VITALS — BP 136/71 | HR 57 | Ht 64.0 in | Wt 211.0 lb

## 2017-12-31 DIAGNOSIS — G8929 Other chronic pain: Secondary | ICD-10-CM

## 2017-12-31 DIAGNOSIS — M25561 Pain in right knee: Secondary | ICD-10-CM

## 2017-12-31 DIAGNOSIS — M25562 Pain in left knee: Secondary | ICD-10-CM | POA: Diagnosis not present

## 2017-12-31 NOTE — Patient Instructions (Signed)

## 2017-12-31 NOTE — Progress Notes (Signed)
Chief Complaint  Patient presents with  . Knee Pain    bilateral   . Injections    requesting injections today      Procedure note left knee injection verbal consent was obtained to inject left knee joint  Timeout was completed to confirm the site of injection  The medications used were 40 mg of Depo-Medrol and 1% lidocaine 3 cc  Anesthesia was provided by ethyl chloride and the skin was prepped with alcohol.  After cleaning the skin with alcohol a 20-gauge needle was used to inject the left knee joint. There were no complications. A sterile bandage was applied.   Procedure note right knee injection verbal consent was obtained to inject right knee joint  Timeout was completed to confirm the site of injection  The medications used were 40 mg of Depo-Medrol and 1% lidocaine 3 cc  Anesthesia was provided by ethyl chloride and the skin was prepped with alcohol.  After cleaning the skin with alcohol a 20-gauge needle was used to inject the right knee joint. There were no complications. A sterile bandage was applied.   Encounter Diagnosis  Name Primary?  . Chronic pain of both knees Yes

## 2018-01-17 NOTE — Progress Notes (Signed)
[facility] Note  01/18/2018     CHIEF COMPLAINT Patient presents for Retina Follow Up   HISTORY OF PRESENT ILLNESS: Anna Mcconnell is a 84 y.o. female who presents to the clinic today for:   HPI    Retina Follow Up    Patient presents with  Dry AMD.  In right eye.  This started 6 months ago.  Severity is mild.  Since onset it is stable.  I, the attending physician,  performed the HPI with the patient and updated documentation appropriately.          Comments    F/u EXU AMD OD;NON EXU AMD OS. Patient states "she can not see that much better".Denies floaters , flashes and ocular pain. Using Soothe gtts PRN . Taking AREDS BID. Pt reports she is going to be moving to [place] next month, she is concerned who she can continue her eye care.        Last edited by Bernarda Caffey, MD on 01/18/2018  1:52 PM. (History)     Pt reports she is planning on moving to [place], [place] next month;   Referring physician: Lemmie Evens, MD [address], [place] [address]  Shanon Brow, [address], [place] [address]   HISTORICAL INFORMATION:   Selected notes from the MEDICAL RECORD NUMBER Referral from Dr. Radford Pax for new-onset floaters and decreased vision OD - onset ~8.11.18  Prior ocular history: pseudophakia OU Gershon Crane, 2013); OHT/POAG suspect    CURRENT MEDICATIONS: Current Outpatient Medications (Ophthalmic Drugs)  Medication Sig  . Propylene Glycol-Glycerin (SOOTHE) 0.6-0.6 % SOLN Apply to eye.   Current Facility-Administered Medications (Ophthalmic Drugs)  Medication Route  . aflibercept (EYLEA) SOLN 2 mg Intravitreal  . aflibercept (EYLEA) SOLN 2 mg Intravitreal  . aflibercept (EYLEA) SOLN 2 mg Intravitreal  . aflibercept (EYLEA) SOLN 2 mg Intravitreal  . aflibercept (EYLEA) SOLN 2 mg Intravitreal   Current Outpatient Medications (Other)  Medication Sig  . atenolol (TENORMIN) 50 MG tablet Take 50 mg by mouth daily.    . hydrochlorothiazide (HYDRODIURIL) 25 MG tablet Take 25 mg by mouth daily.   Marland Kitchen ketoconazole (NIZORAL) 2 % cream Apply 1 application topically 2 (two) times daily. Continue for at least 48 hours after rash resolves.  Marland Kitchen LORazepam (ATIVAN) 1 MG tablet Take 1 mg by mouth daily as needed for anxiety. Only as needed     Not even every daily  . Multiple Vitamins-Minerals (ICAPS AREDS FORMULA PO) Take by mouth.  . naproxen (NAPROSYN) 500 MG tablet Take 500 mg by mouth 2 (two) times daily with a meal.  . polyethylene glycol (MIRALAX / GLYCOLAX) packet Take 17 g by mouth daily.  . traMADol (ULTRAM) 50 MG tablet Take 50 mg by mouth daily as needed (pain).   . Vitamin D, Ergocalciferol, (DRISDOL) 50000 UNITS CAPS capsule Take 50,000 Units by mouth every 7 (seven) days. Fridays   Current Facility-Administered Medications (Other)  Medication Route  . Bevacizumab (AVASTIN) SOLN 1.25 mg Intravitreal  . Bevacizumab (AVASTIN) SOLN 1.25 mg Intravitreal      REVIEW OF SYSTEMS: ROS    Positive for: Eyes   Last edited by Zenovia Jordan, LPN on 02/12/4007  6:76 PM. (History)       ALLERGIES Allergies  Allergen Reactions  . Psyllium Other (See Comments)    bloats    PAST MEDICAL HISTORY Past Medical History:  Diagnosis Date  . Arthritis   . HTN (  hypertension)   . Spinal stenosis   . Varicose veins of left lower extremity with edema    Past Surgical History:  Procedure Laterality Date  . CATARACT EXTRACTION Bilateral 2013   Dr. Gershon Crane  . CHOLECYSTECTOMY    . COLONOSCOPY  09/06/2006   HCW:CBJSEG rectum.  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  Sigmoid diverticula.  The remainder of the colonic mucosa normal  . COLONOSCOPY N/A 09/25/2014   RMR:No rectal mass on digital rectal exam or with colonoscopic visualization. Internal hemorrhoids. Poor prep precluded complete examination, limited to sigmoid colon.  . COLONOSCOPY N/A 04/11/2015   BTD:VVOHYWV diverticulosis  .  FRACTURE SURGERY Right    ankle  . hysterectomy    . varicose vein ligation      FAMILY HISTORY Family History  Problem Relation Age of Onset  . Tuberculosis Mother        deceased at young age  . Tuberculosis Father        deceased at young age  . Colon cancer Neg Hx   . Amblyopia Neg Hx   . Cataracts Neg Hx   . Glaucoma Neg Hx   . Macular degeneration Neg Hx   . Retinal detachment Neg Hx   . Strabismus Neg Hx   . Retinitis pigmentosa Neg Hx     SOCIAL HISTORY Social History   Tobacco Use  . Smoking status: Former Smoker    Last attempt to quit: 07/20/2007    Years since quitting: 10.5  . Smokeless tobacco: Never Used  . Tobacco comment: Quit smoking x 25 years  Substance Use Topics  . Alcohol use: Not on file  . Drug use: Not on file         OPHTHALMIC EXAM:  Base Eye Exam    Visual Acuity (Snellen - Linear)      Right Left   Dist cc CF at 2' 20/25   Dist ph cc NI NI   Correction:  Glasses       Tonometry (Tonopen, 1:37 PM)      Right Left   Pressure 29 20       Tonometry #2 (Tonopen, 1:37 PM)      Right Left   Pressure 21        Pupils      Dark Light Shape React APD   Right 4 2 Round Brisk None   Left 4 2 Round Brisk None       Visual Fields (Counting fingers)      Left Right    Full    Restrictions  Central scotoma       Extraocular Movement      Right Left    Full, Ortho Full, Ortho       Neuro/Psych    Oriented x3:  Yes   Mood/Affect:  Normal       Dilation    Both eyes:  1.0% Mydriacyl, 2.5% Phenylephrine @ 1:37 PM        Slit Lamp and Fundus Exam    External Exam      Right Left   External Normal Normal       Slit Lamp Exam      Right Left   Lids/Lashes Dermatochalasis - upper lid Dermatochalasis - upper lid   Conjunctiva/Sclera Pinguecula Pinguecula   Cornea early band K  at 3 o'clock and at 9 o'clock early band K  at 3 o'clock and at 9 o'clock   Anterior Chamber Deep and quiet Deep  and quiet   Iris Round and  reactive; dilated Round and reactive; dilated   Lens Posterior chamber intraocular lens, Posterior capsular opacification - mild;  Posterior chamber intraocular lens, Posterior capsular opacification - mild;    Vitreous Vitreous syneresis; PVD Vitreous syneresis; PVD       Fundus Exam      Right Left   Disc Normal Normal   C/D Ratio 0.3 0.3   Macula persistent edema / SRF w/ exudation; PED / CNVM within SRF; macular pucker; no heme, interval improvement in SRF Flat with RPE changes, druplets; trace ERM   Vessels Normal Normal   Periphery Flat; reticlaur degeneration flat; reticular degeneration          IMAGING AND PROCEDURES  Imaging and Procedures for 01/18/18  OCT, Retina - OU - Both Eyes     Right Eye Quality was good. Central Foveal Thickness: 550. Progression has improved. Findings include abnormal foveal contour, choroidal neovascular membrane, pigment epithelial detachment, subretinal fluid.   Left Eye Quality was good. Central Foveal Thickness: 358. Progression has been stable. Findings include abnormal foveal contour, epiretinal membrane, retinal drusen , no IRF, no SRF.   Notes Images taken, stored on drive  Diagnosis / Impression:  OD: Exudative ARMD with SRF -- interval improvement OS: nonexudative ARMD; ERM  Clinical management:  See below  Abbreviations: NFP - Normal foveal profile. CME - cystoid macular edema. PED - pigment epithelial detachment. IRF - intraretinal fluid. SRF - subretinal fluid. EZ - ellipsoid zone. ERM - epiretinal membrane. ORA - outer retinal atrophy. ORT - outer retinal tubulation. SRHM - subretinal hyper-reflective material         Intravitreal Injection, Pharmacologic Agent - OD - Right Eye     Time Out 01/18/2018. 2:24 PM. Confirmed correct patient, procedure, site, and patient consented.   Anesthesia Anesthetic medications included Lidocaine 2%, Tetracaine 0.5%.   Procedure Preparation included 5% betadine to ocular surface,  eyelid speculum. A 30 gauge needle was used.   Injection: 2 mg aflibercept 2 MG/0.05ML   NDC: 16109-604-54    Lot: [id number]    Expiration Date: 08/29/2018   Route: Intravitreal   Site: Right Eye   Waste: 0.05 mg  Post-op Post injection exam found visual acuity of at least counting fingers. The patient tolerated the procedure well. There were no complications. The patient received written and verbal post procedure care education.                 ASSESSMENT/PLAN:    ICD-10-CM   1. Exudative age-related macular degeneration of right eye with active choroidal neovascularization (HCC) H35.3211 OCT, Retina - OU - Both Eyes    Intravitreal Injection, Pharmacologic Agent - OD - Right Eye    aflibercept (EYLEA) SOLN 2 mg  2. Intermediate stage nonexudative age-related macular degeneration of left eye H35.3122   3. Ocular hypertension, bilateral H40.053   4. Epiretinal membrane (ERM) of left eye H35.372   5. Pseudophakia of both eyes Z96.1     1. Exudative age related macular degeneration, right eye. The incidence pathology and anatomy of wet AMD discussed with patient including treatment options with intravitreal anti VEGF agents like Lucentis, Avastin and Eylea.  The ANCHOR, MARINA, CATT and VIEW trials discussed with patient.  Risks of endophthalmitis and vascular occlusive events and atrophic changes discussed with patient.  - at initial visit: massive IRF/SRF on OCT, VA CF OD; active CNVM with significant leakage   - s/p IVA OD #1 (8.20.18), #2 (9.18.18)   -  s/p IVE OD #1 (10.16.18), #2 (11.13.18), #3 (12.14.18) -- 39 day interval, #4 (01.22.19)  - today, OCT shows interval improvement in SRF w/ 4 wk follow up  - VA CF at 2' OD  - recommend IVE OD #5 today (02.19.19)  - f/u 4 wks for OCT, possible injection OD  - pt likely moving to [facility] [place] next month -- will need to coordinate her retina f/u to prevent lapse in her treatment  2. Age related macular degeneration,  non-exudative, left eye.   The incidence, anatomy, and pathology of dry AMD, risk of progression, and the AREDS and AREDS 2 study including smoking risks discussed with patient.  - monitor  3. Epiretinal membrane, left eye The natural history, anatomy, potential for loss of vision, and treatment options including vitrectomy techniques and the complications of endophthalmitis, retinal detachment, vitreous hemorrhage, cataract progression and permanent vision loss discussed with the patient.  - ERM very mild with no symptoms  - monitor  4. Ocular hypertension OU  - IOP ok today OU  - will continue to monitor for now with particular attention to IOP OD with intravitreal injections  5. Pseudophakia OU  - h/o of cataract surgery with Gershon Crane in 2013  - doing well  - monitor     Ophthalmic Meds Ordered this visit:  Meds ordered this encounter  Medications  . aflibercept (EYLEA) SOLN 2 mg       Return in about 4 weeks (around 02/15/2018) for Dilated Exam, OCT - f/u exudative AMD OD.  There are no Patient Instructions on file for this visit.   Explained the diagnoses, plan, and follow up with the patient and they expressed understanding.  Patient expressed understanding of the importance of proper follow up care.   This document serves as a record of services personally performed by Gardiner Sleeper, MD, PhD. It was created on their behalf by Catha Brow, [place], a certified ophthalmic assistant. The creation of this record is the provider's dictation and/or activities during the visit.   Electronically signed by: Catha Brow, [place]  01/18/18 3:56 PM   Gardiner Sleeper, M.D., Ph.D. Vitreoretinal Surgeon [facility] 01/18/18   I have reviewed the above documentation for accuracy and completeness, and I agree with the above. Gardiner Sleeper, M.D., Ph.D. 01/18/18 3:56 PM    Abbreviations: M myopia (nearsighted); A astigmatism; H hyperopia (farsighted);  P presbyopia; Mrx spectacle prescription;  CTL contact lenses; OD right eye; OS left eye; OU both eyes  XT exotropia; ET esotropia; PEK punctate epithelial keratitis; PEE punctate epithelial erosions; DES dry eye syndrome; MGD meibomian gland dysfunction; ATs artificial tears; PFAT's preservative free artificial tears; [place] nuclear sclerotic cataract; PSC posterior subcapsular cataract; ERM epi-retinal membrane; PVD posterior vitreous detachment; RD retinal detachment; DM diabetes mellitus; DR diabetic retinopathy; NPDR non-proliferative diabetic retinopathy; PDR proliferative diabetic retinopathy; CSME clinically significant macular edema; DME diabetic macular edema; dbh dot blot hemorrhages; CWS cotton wool spot; POAG primary open angle glaucoma; C/D cup-to-disc ratio; HVF humphrey visual field; GVF goldmann visual field; OCT optical coherence tomography; IOP intraocular pressure; BRVO Branch retinal vein occlusion; CRVO central retinal vein occlusion; CRAO central retinal artery occlusion; BRAO branch retinal artery occlusion; RT retinal tear; SB scleral buckle; PPV pars plana vitrectomy; VH Vitreous hemorrhage; PRP panretinal laser photocoagulation; IVK intravitreal kenalog; VMT vitreomacular traction; MH Macular hole;  NVD neovascularization of the disc; NVE neovascularization elsewhere; AREDS age related eye disease study; ARMD age related macular degeneration; POAG  primary open angle glaucoma; EBMD epithelial/anterior basement membrane dystrophy; ACIOL anterior chamber intraocular lens; IOL intraocular lens; PCIOL posterior chamber intraocular lens; Phaco/IOL phacoemulsification with intraocular lens placement; [place] photorefractive keratectomy; LASIK laser assisted in situ keratomileusis; HTN hypertension; DM diabetes mellitus; COPD chronic obstructive pulmonary disease

## 2018-01-18 ENCOUNTER — Ambulatory Visit (INDEPENDENT_AMBULATORY_CARE_PROVIDER_SITE_OTHER): Payer: PPO | Admitting: Ophthalmology

## 2018-01-18 ENCOUNTER — Encounter (INDEPENDENT_AMBULATORY_CARE_PROVIDER_SITE_OTHER): Payer: Self-pay | Admitting: Ophthalmology

## 2018-01-18 DIAGNOSIS — H353122 Nonexudative age-related macular degeneration, left eye, intermediate dry stage: Secondary | ICD-10-CM | POA: Diagnosis not present

## 2018-01-18 DIAGNOSIS — H35372 Puckering of macula, left eye: Secondary | ICD-10-CM

## 2018-01-18 DIAGNOSIS — Z961 Presence of intraocular lens: Secondary | ICD-10-CM

## 2018-01-18 DIAGNOSIS — H353211 Exudative age-related macular degeneration, right eye, with active choroidal neovascularization: Secondary | ICD-10-CM

## 2018-01-18 DIAGNOSIS — H40053 Ocular hypertension, bilateral: Secondary | ICD-10-CM

## 2018-01-18 MED ORDER — AFLIBERCEPT 2MG/0.05ML IZ SOLN FOR KALEIDOSCOPE
2.0000 mg | INTRAVITREAL | Status: AC
  Administered 2018-01-18: 2 mg via INTRAVITREAL

## 2018-01-31 DIAGNOSIS — I8391 Asymptomatic varicose veins of right lower extremity: Secondary | ICD-10-CM | POA: Diagnosis not present

## 2018-01-31 DIAGNOSIS — I10 Essential (primary) hypertension: Secondary | ICD-10-CM | POA: Diagnosis not present

## 2018-01-31 DIAGNOSIS — M170 Bilateral primary osteoarthritis of knee: Secondary | ICD-10-CM | POA: Diagnosis not present

## 2018-01-31 DIAGNOSIS — G4700 Insomnia, unspecified: Secondary | ICD-10-CM | POA: Diagnosis not present

## 2018-02-14 NOTE — Progress Notes (Signed)
[facility] Note  02/15/2018     CHIEF COMPLAINT Patient presents for Retina Follow Up   HISTORY OF PRESENT ILLNESS: Anna Mcconnell is a 84 y.o. female who presents to the clinic today for:   HPI    Retina Follow Up    Patient presents with  Wet AMD.  In right eye.  This started 6 months ago.  Severity is mild.  Since onset it is stable.  I, the attending physician,  performed the HPI with the patient and updated documentation appropriately.          Comments    F/U EXU AMD OD. Patient states her vision is about the "same", some days she can see better than others. Pt reports she is moving on March 09 2018 to [place] today is her last ov with DR. [name]. Pt reports she fell Last Thursday (02/10/18) bruising  her rt  Knee ,causing it to be sore, No fx. Pt states she is ready for Eylea today, pt states she has been having headaches for the past couple of days, she does not normally have headaches, pt wondered if it could be because of her IOP or because of the fall,        Last edited by Bernarda Caffey, MD on 02/15/2018  2:12 PM. (History)     Pt reports she has an appointment on April 18th with new retina specialist in [place], pt reports the new retina specialist takes [facility] for [facility];   Referring physician: Lemmie Evens, MD [place], [place] [address]  Shanon Brow, [place] [address], [place] [address]   HISTORICAL INFORMATION:   Selected notes from the MEDICAL RECORD NUMBER Referral from Dr. Radford Pax for new-onset floaters and decreased vision OD - onset ~8.11.18  Prior ocular history: pseudophakia OU Gershon Crane, 2013); OHT/POAG suspect    CURRENT MEDICATIONS: Current Outpatient Medications (Ophthalmic Drugs)  Medication Sig  . Propylene Glycol-Glycerin (SOOTHE) 0.6-0.6 % SOLN Apply to eye.   Current Facility-Administered Medications (Ophthalmic Drugs)  Medication Route  . aflibercept (EYLEA) SOLN 2 mg Intravitreal  .  aflibercept (EYLEA) SOLN 2 mg Intravitreal  . aflibercept (EYLEA) SOLN 2 mg Intravitreal  . aflibercept (EYLEA) SOLN 2 mg Intravitreal  . aflibercept (EYLEA) SOLN 2 mg Intravitreal  . aflibercept (EYLEA) SOLN 2 mg Intravitreal   Current Outpatient Medications (Other)  Medication Sig  . atenolol (TENORMIN) 50 MG tablet Take 50 mg by mouth daily.   . hydrochlorothiazide (HYDRODIURIL) 25 MG tablet Take 25 mg by mouth daily.   Marland Kitchen ketoconazole (NIZORAL) 2 % cream Apply 1 application topically 2 (two) times daily. Continue for at least 48 hours after rash resolves.  Marland Kitchen LORazepam (ATIVAN) 1 MG tablet Take 1 mg by mouth daily as needed for anxiety. Only as needed     Not even every daily  . Multiple Vitamins-Minerals (ICAPS AREDS FORMULA PO) Take by mouth.  . naproxen (NAPROSYN) 500 MG tablet Take 500 mg by mouth 2 (two) times daily with a meal.  . polyethylene glycol (MIRALAX / GLYCOLAX) packet Take 17 g by mouth daily.  . traMADol (ULTRAM) 50 MG tablet Take 50 mg by mouth daily as needed (pain).   . Vitamin D, Ergocalciferol, (DRISDOL) 50000 UNITS CAPS capsule Take 50,000 Units by mouth every 7 (seven) days. Fridays   Current Facility-Administered Medications (Other)  Medication Route  . Bevacizumab (AVASTIN) SOLN 1.25 mg Intravitreal  . Bevacizumab (AVASTIN) SOLN 1.25  mg Intravitreal      REVIEW OF SYSTEMS: ROS    Positive for: Musculoskeletal, Eyes   Negative for: Constitutional, Gastrointestinal, Neurological, Skin, Genitourinary, HENT, Endocrine, Cardiovascular, Respiratory, Psychiatric, Allergic/Imm, Heme/Lymph   Last edited by Zenovia Jordan, LPN on 9/32/3557  3:22 PM. (History)       ALLERGIES Allergies  Allergen Reactions  . Psyllium Other (See Comments)    bloats    PAST MEDICAL HISTORY Past Medical History:  Diagnosis Date  . Arthritis   . HTN (hypertension)   . Spinal stenosis   . Varicose veins of left lower extremity with edema    Past Surgical History:   Procedure Laterality Date  . CATARACT EXTRACTION Bilateral 2013   Dr. Gershon Crane  . CHOLECYSTECTOMY    . COLONOSCOPY  09/06/2006   GUR:KYHCWC rectum.  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  Sigmoid diverticula.  The remainder of the colonic mucosa normal  . COLONOSCOPY N/A 09/25/2014   RMR:No rectal mass on digital rectal exam or with colonoscopic visualization. Internal hemorrhoids. Poor prep precluded complete examination, limited to sigmoid colon.  . COLONOSCOPY N/A 04/11/2015   BJS:EGBTDVV diverticulosis  . FRACTURE SURGERY Right    ankle  . hysterectomy    . varicose vein ligation      FAMILY HISTORY Family History  Problem Relation Age of Onset  . Tuberculosis Mother        deceased at young age  . Tuberculosis Father        deceased at young age  . Colon cancer Neg Hx   . Amblyopia Neg Hx   . Cataracts Neg Hx   . Glaucoma Neg Hx   . Macular degeneration Neg Hx   . Retinal detachment Neg Hx   . Strabismus Neg Hx   . Retinitis pigmentosa Neg Hx     SOCIAL HISTORY Social History   Tobacco Use  . Smoking status: Former Smoker    Last attempt to quit: 07/20/2007    Years since quitting: 10.5  . Smokeless tobacco: Never Used  . Tobacco comment: Quit smoking x 25 years  Substance Use Topics  . Alcohol use: Not on file  . Drug use: Not on file         OPHTHALMIC EXAM:  Base Eye Exam    Visual Acuity (Snellen - Linear)      Right Left   Dist cc CF at 2' 20/25 +2   Dist ph cc NI NI   Correction:  Glasses       Tonometry (Tonopen, 1:46 PM)      Right Left   Pressure 23 21       Tonometry #2 (Tonopen, 1:46 PM)      Right Left   Pressure 21 20       Pupils      Dark Light Shape React APD   Right 4 2 Round Brisk None   Left 4 2 Round Brisk None       Visual Fields      Left Right    Full    Restrictions  Central scotoma       Extraocular Movement      Right Left    Full, Ortho Full, Ortho       Neuro/Psych    Oriented  x3:  Yes   Mood/Affect:  Normal       Dilation    Both eyes:  1.0% Mydriacyl, 2.5% Phenylephrine @ 1:46 PM  Slit Lamp and Fundus Exam    External Exam      Right Left   External Normal Normal       Slit Lamp Exam      Right Left   Lids/Lashes Dermatochalasis - upper lid Dermatochalasis - upper lid   Conjunctiva/Sclera Pinguecula Pinguecula   Cornea early band K  at 3 o'clock and at 9 o'clock early band K  at 3 o'clock and at 9 o'clock   Anterior Chamber Deep and quiet Deep and quiet   Iris Round and reactive; dilated Round and reactive; dilated   Lens Posterior chamber intraocular lens, Posterior capsular opacification - mild;  Posterior chamber intraocular lens, Posterior capsular opacification - mild;    Vitreous Vitreous syneresis; PVD Vitreous syneresis; PVD       Fundus Exam      Right Left   Disc Normal Normal   C/D Ratio 0.3 0.3   Macula persistent edema / SRF w/ exudation; PED / CNVM within SRF; macular pucker; no heme, mild interval improvement in SRF Flat with RPE changes, druplets; trace ERM   Vessels Normal Normal   Periphery Flat; reticlaur degeneration flat; reticular degeneration          IMAGING AND PROCEDURES  Imaging and Procedures for 02/15/18  OCT, Retina - OU - Both Eyes     Right Eye Quality was good. Central Foveal Thickness: 548. Progression has improved. Findings include abnormal foveal contour, choroidal neovascular membrane, pigment epithelial detachment, subretinal fluid (Mild interval improvement in SRF; +RPE tear).   Left Eye Quality was good. Central Foveal Thickness: 359. Progression has been stable. Findings include abnormal foveal contour, epiretinal membrane, retinal drusen , no IRF, no SRF.   Notes Images taken, stored on drive  Diagnosis / Impression:  OD: Exudative ARMD with SRF -- mild interval improvement OS: nonexudative ARMD; ERM  Clinical management:  See below  Abbreviations: NFP - Normal foveal profile. CME  - cystoid macular edema. PED - pigment epithelial detachment. IRF - intraretinal fluid. SRF - subretinal fluid. EZ - ellipsoid zone. ERM - epiretinal membrane. ORA - outer retinal atrophy. ORT - outer retinal tubulation. SRHM - subretinal hyper-reflective material         Intravitreal Injection, Pharmacologic Agent - OD - Right Eye     Time Out 02/15/2018. 2:23 PM. Confirmed correct patient, procedure, site, and patient consented.   Anesthesia Topical anesthesia was used. Anesthetic medications included Lidocaine 2%, Tetracaine 0.5%.   Procedure Preparation included 5% betadine to ocular surface, eyelid speculum. A 30 gauge needle was used.   Injection: 2 mg aflibercept 2 MG/0.05ML   NDC: 95188-416-60    Lot: [id number]    Expiration Date: 09/29/2018   Route: Intravitreal   Site: Right Eye   Waste: 0.05 mg  Post-op Post injection exam found visual acuity of at least counting fingers. The patient tolerated the procedure well. There were no complications. The patient received written and verbal post procedure care education.                 ASSESSMENT/PLAN:    ICD-10-CM   1. Exudative age-related macular degeneration of right eye with active choroidal neovascularization (HCC) H35.3211 OCT, Retina - OU - Both Eyes    Intravitreal Injection, Pharmacologic Agent - OD - Right Eye    aflibercept (EYLEA) SOLN 2 mg  2. Intermediate stage nonexudative age-related macular degeneration of left eye H35.3122   3. Ocular hypertension, bilateral H40.053   4. Epiretinal membrane (ERM) of  left eye H35.372   5. Pseudophakia of both eyes Z96.1     1. Exudative age related macular degeneration, right eye. The incidence pathology and anatomy of wet AMD discussed with patient including treatment options with intravitreal anti VEGF agents like Lucentis, Avastin and Eylea.  The ANCHOR, MARINA, CATT and VIEW trials discussed with patient.  Risks of endophthalmitis and vascular occlusive events  and atrophic changes discussed with patient.  - at initial visit: massive IRF/SRF on OCT, VA CF OD; active CNVM with significant leakage   - very severe case with RPE tear and very active CNV  - s/p IVA OD #1 (8.20.18), #2 (9.18.18)   - s/p IVE OD #1 (10.16.18), #2 (11.13.18), #3 (12.14.18) -- 39 day interval, #4 (01.22.19), #5 (02.19.19)  - today, OCT shows interval improvement in SRF w/ 4 wk follow up  - VA CF at 2' OD  - recommend IVE OD #6 today (03.19.19)  - pt moving to [facility], [place] next month on April 10th  - daughter has arranged f/u with retina specialist in [place], [place] -- Dr. Pervis Hocking  - printed all OCTs of OD for pt to bring to new retina doc  - will send clinic notes   2. Age related macular degeneration, non-exudative, left eye.   The incidence, anatomy, and pathology of dry AMD, risk of progression, and the AREDS and AREDS 2 study including smoking risks discussed with patient.  - monitor  3. Epiretinal membrane, left eye The natural history, anatomy, potential for loss of vision, and treatment options including vitrectomy techniques and the complications of endophthalmitis, retinal detachment, vitreous hemorrhage, cataract progression and permanent vision loss discussed with the patient.  - ERM very mild with no symptoms  - monitor  4. Ocular hypertension OU  - IOP ok today OU  - will continue to monitor for now with particular attention to IOP OD with intravitreal injections  5. Pseudophakia OU  - h/o of cataract surgery with Gershon Crane in 2013  - doing well  - monitor     Ophthalmic Meds Ordered this visit:  Meds ordered this encounter  Medications  . aflibercept (EYLEA) SOLN 2 mg       No Follow-up on file.  There are no Patient Instructions on file for this visit.   Explained the diagnoses, plan, and follow up with the patient and they expressed understanding.  Patient expressed understanding of the importance of proper follow up care.   This  document serves as a record of services personally performed by Gardiner Sleeper, MD, PhD. It was created on their behalf by Catha Brow, [place], a certified ophthalmic assistant. The creation of this record is the provider's dictation and/or activities during the visit.  Electronically signed by: Catha Brow, [place]  02/15/18 3:58 PM   Gardiner Sleeper, M.D., Ph.D. Vitreoretinal Surgeon [facility] 02/15/18  I have reviewed the above documentation for accuracy and completeness, and I agree with the above. Gardiner Sleeper, M.D., Ph.D. 02/15/18 4:02 PM    Abbreviations: M myopia (nearsighted); A astigmatism; H hyperopia (farsighted); P presbyopia; Mrx spectacle prescription;  CTL contact lenses; OD right eye; OS left eye; OU both eyes  XT exotropia; ET esotropia; PEK punctate epithelial keratitis; PEE punctate epithelial erosions; DES dry eye syndrome; MGD meibomian gland dysfunction; ATs artificial tears; PFAT's preservative free artificial tears; [place] nuclear sclerotic cataract; PSC posterior subcapsular cataract; ERM epi-retinal membrane; PVD posterior vitreous detachment; RD retinal detachment; DM diabetes  mellitus; DR diabetic retinopathy; NPDR non-proliferative diabetic retinopathy; PDR proliferative diabetic retinopathy; CSME clinically significant macular edema; DME diabetic macular edema; dbh dot blot hemorrhages; CWS cotton wool spot; POAG primary open angle glaucoma; C/D cup-to-disc ratio; HVF humphrey visual field; GVF goldmann visual field; OCT optical coherence tomography; IOP intraocular pressure; BRVO Branch retinal vein occlusion; CRVO central retinal vein occlusion; CRAO central retinal artery occlusion; BRAO branch retinal artery occlusion; RT retinal tear; SB scleral buckle; PPV pars plana vitrectomy; VH Vitreous hemorrhage; PRP panretinal laser photocoagulation; IVK intravitreal kenalog; VMT vitreomacular traction; MH Macular hole;  NVD neovascularization of  the disc; NVE neovascularization elsewhere; AREDS age related eye disease study; ARMD age related macular degeneration; POAG primary open angle glaucoma; EBMD epithelial/anterior basement membrane dystrophy; ACIOL anterior chamber intraocular lens; IOL intraocular lens; PCIOL posterior chamber intraocular lens; Phaco/IOL phacoemulsification with intraocular lens placement; [place] photorefractive keratectomy; LASIK laser assisted in situ keratomileusis; HTN hypertension; DM diabetes mellitus; COPD chronic obstructive pulmonary disease

## 2018-02-15 ENCOUNTER — Encounter (INDEPENDENT_AMBULATORY_CARE_PROVIDER_SITE_OTHER): Payer: Self-pay | Admitting: Ophthalmology

## 2018-02-15 ENCOUNTER — Ambulatory Visit (INDEPENDENT_AMBULATORY_CARE_PROVIDER_SITE_OTHER): Payer: PPO | Admitting: Ophthalmology

## 2018-02-15 DIAGNOSIS — Z961 Presence of intraocular lens: Secondary | ICD-10-CM | POA: Diagnosis not present

## 2018-02-15 DIAGNOSIS — H353211 Exudative age-related macular degeneration, right eye, with active choroidal neovascularization: Secondary | ICD-10-CM

## 2018-02-15 DIAGNOSIS — H40053 Ocular hypertension, bilateral: Secondary | ICD-10-CM

## 2018-02-15 DIAGNOSIS — H35372 Puckering of macula, left eye: Secondary | ICD-10-CM | POA: Diagnosis not present

## 2018-02-15 DIAGNOSIS — H353122 Nonexudative age-related macular degeneration, left eye, intermediate dry stage: Secondary | ICD-10-CM

## 2018-02-15 MED ORDER — AFLIBERCEPT 2MG/0.05ML IZ SOLN FOR KALEIDOSCOPE
2.0000 mg | INTRAVITREAL | Status: AC
  Administered 2018-02-15: 2 mg via INTRAVITREAL

## 2018-02-24 ENCOUNTER — Telehealth: Payer: Self-pay | Admitting: Radiology

## 2018-02-24 NOTE — Telephone Encounter (Signed)
Patient had injections in her knees on Feb 1st, is sch. To come back in May to repeat. Her knees are painful, she is asking if she can come in for injections now. She is moving on April 10th and feels like she needs the injections before then  [facility] to move up the appointment ?

## 2018-02-24 NOTE — Telephone Encounter (Signed)
April 3 0r 4 check schedule to see

## 2018-03-01 ENCOUNTER — Ambulatory Visit: Payer: PPO | Admitting: Orthopedic Surgery

## 2018-03-01 ENCOUNTER — Encounter: Payer: Self-pay | Admitting: Orthopedic Surgery

## 2018-03-01 VITALS — BP 120/84 | HR 84 | Ht 64.0 in | Wt 209.0 lb

## 2018-03-01 DIAGNOSIS — M25562 Pain in left knee: Secondary | ICD-10-CM

## 2018-03-01 DIAGNOSIS — G8929 Other chronic pain: Secondary | ICD-10-CM | POA: Diagnosis not present

## 2018-03-01 DIAGNOSIS — M170 Bilateral primary osteoarthritis of knee: Secondary | ICD-10-CM

## 2018-03-01 DIAGNOSIS — M25561 Pain in right knee: Secondary | ICD-10-CM

## 2018-03-01 NOTE — Progress Notes (Signed)
Chief Complaint  Patient presents with  . Knee Pain    bilateral   . Injections    wants injections bilateral knees     Procedure note left knee injection verbal consent was obtained to inject left knee joint  Timeout was completed to confirm the site of injection  The medications used were 40 mg of Depo-Medrol and 1% lidocaine 3 cc  Anesthesia was provided by ethyl chloride and the skin was prepped with alcohol.  After cleaning the skin with alcohol a 20-gauge needle was used to inject the left knee joint. There were no complications. A sterile bandage was applied.   Procedure note right knee injection verbal consent was obtained to inject right knee joint  Timeout was completed to confirm the site of injection  The medications used were 40 mg of Depo-Medrol and 1% lidocaine 3 cc  Anesthesia was provided by ethyl chloride and the skin was prepped with alcohol.  After cleaning the skin with alcohol a 20-gauge needle was used to inject the right knee joint. There were no complications. A sterile bandage was applied.  Encounter Diagnoses  Name Primary?  . Chronic pain of both knees Yes  . Primary osteoarthritis of both knees

## 2018-03-03 DIAGNOSIS — Z1283 Encounter for screening for malignant neoplasm of skin: Secondary | ICD-10-CM | POA: Diagnosis not present

## 2018-03-03 DIAGNOSIS — Z85828 Personal history of other malignant neoplasm of skin: Secondary | ICD-10-CM | POA: Diagnosis not present

## 2018-03-03 DIAGNOSIS — Z08 Encounter for follow-up examination after completed treatment for malignant neoplasm: Secondary | ICD-10-CM | POA: Diagnosis not present

## 2018-03-17 DIAGNOSIS — H35372 Puckering of macula, left eye: Secondary | ICD-10-CM | POA: Diagnosis not present

## 2018-03-17 DIAGNOSIS — H353122 Nonexudative age-related macular degeneration, left eye, intermediate dry stage: Secondary | ICD-10-CM | POA: Diagnosis not present

## 2018-03-17 DIAGNOSIS — H43813 Vitreous degeneration, bilateral: Secondary | ICD-10-CM | POA: Diagnosis not present

## 2018-03-17 DIAGNOSIS — I10 Essential (primary) hypertension: Secondary | ICD-10-CM | POA: Diagnosis not present

## 2018-03-17 DIAGNOSIS — H353211 Exudative age-related macular degeneration, right eye, with active choroidal neovascularization: Secondary | ICD-10-CM | POA: Diagnosis not present

## 2018-03-30 ENCOUNTER — Ambulatory Visit: Payer: PPO | Admitting: Orthopedic Surgery

## 2018-04-26 IMAGING — MG MM DIGITAL SCREENING BILAT W/ TOMO W/ CAD
6 of 10 series · 6 of 26 positions shown · non-contrast
Comparison: Previous exam(s).

CLINICAL DATA: Screening.

EXAM:
2D DIGITAL SCREENING BILATERAL MAMMOGRAM WITH CAD AND ADJUNCT TOMO

[R MLO (1 of 2)]
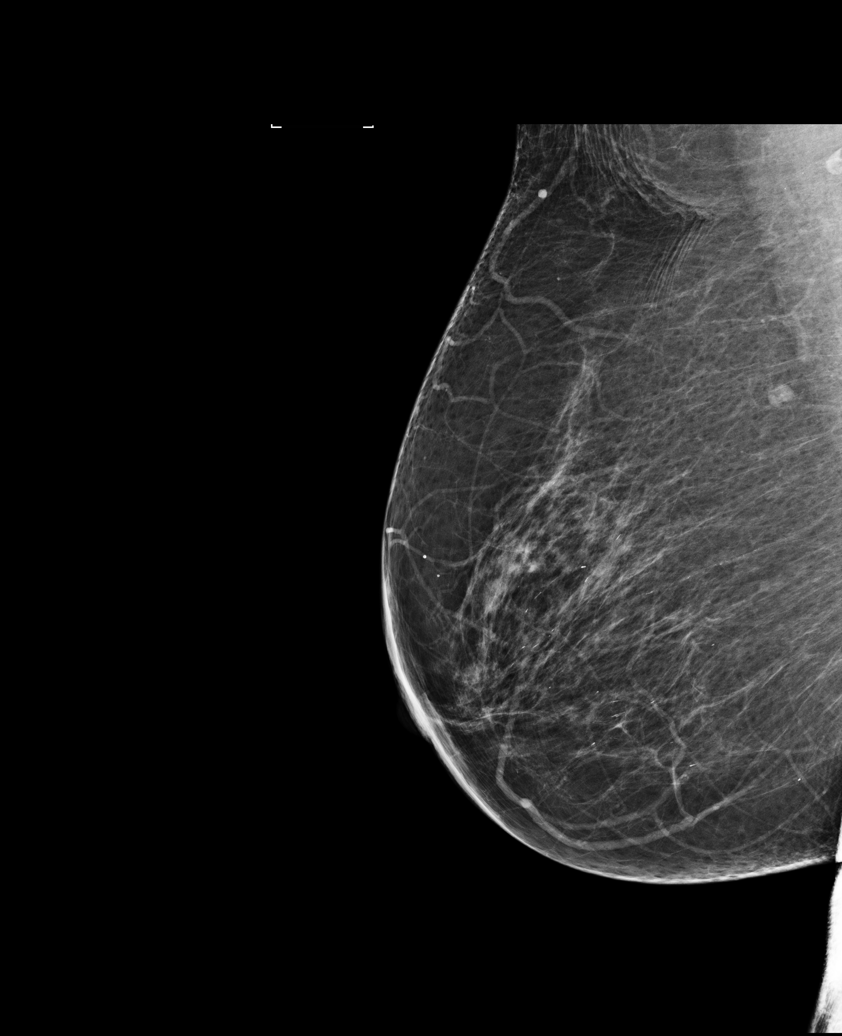

[L MLO (1 of 2)]
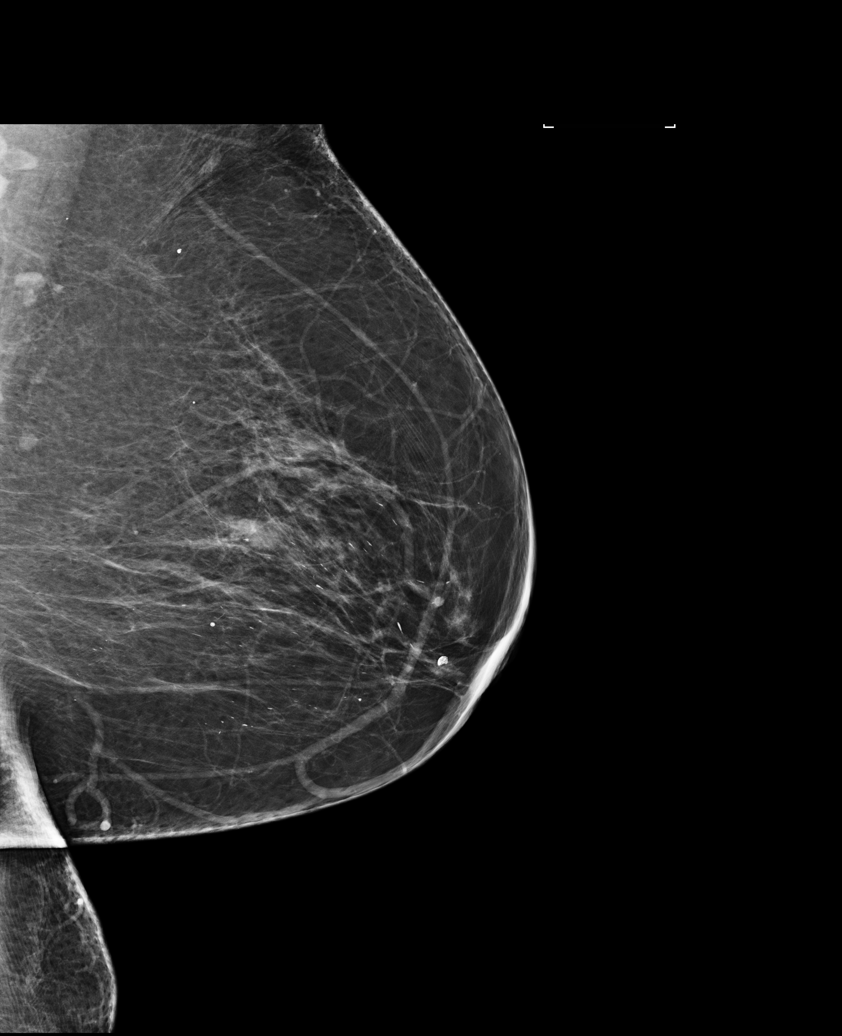

[L CC]
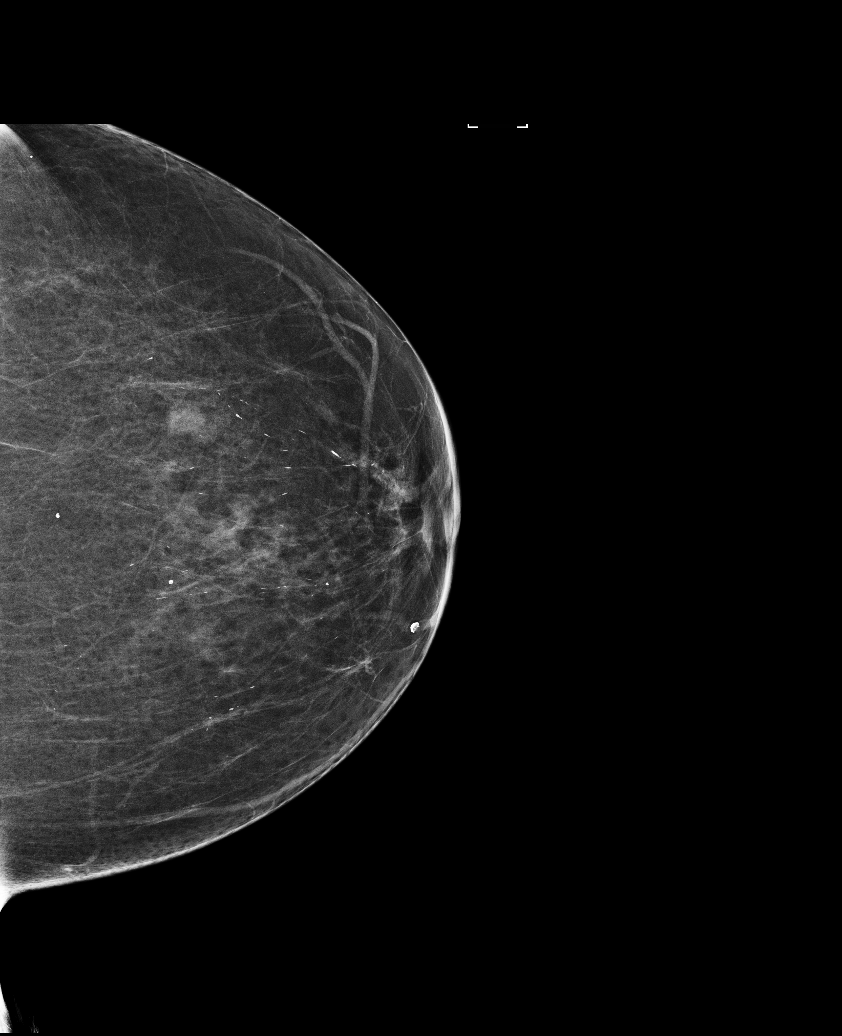

[R MLO (2 of 2)]
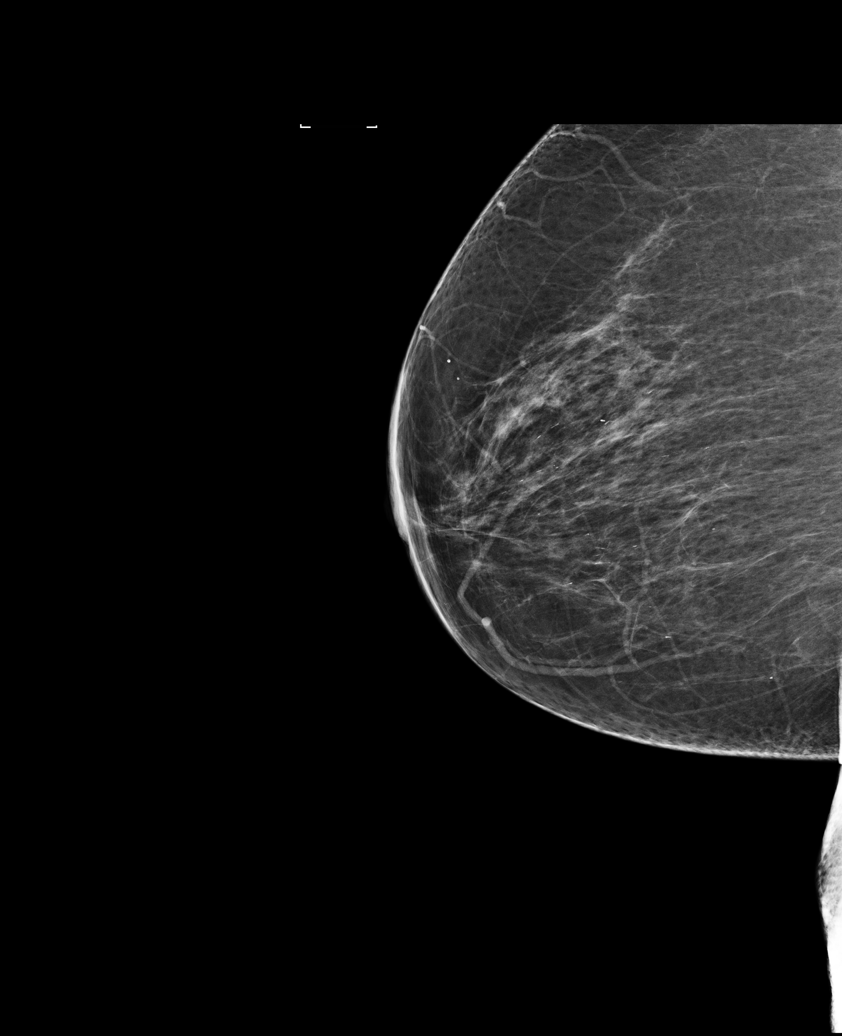

[R CC]
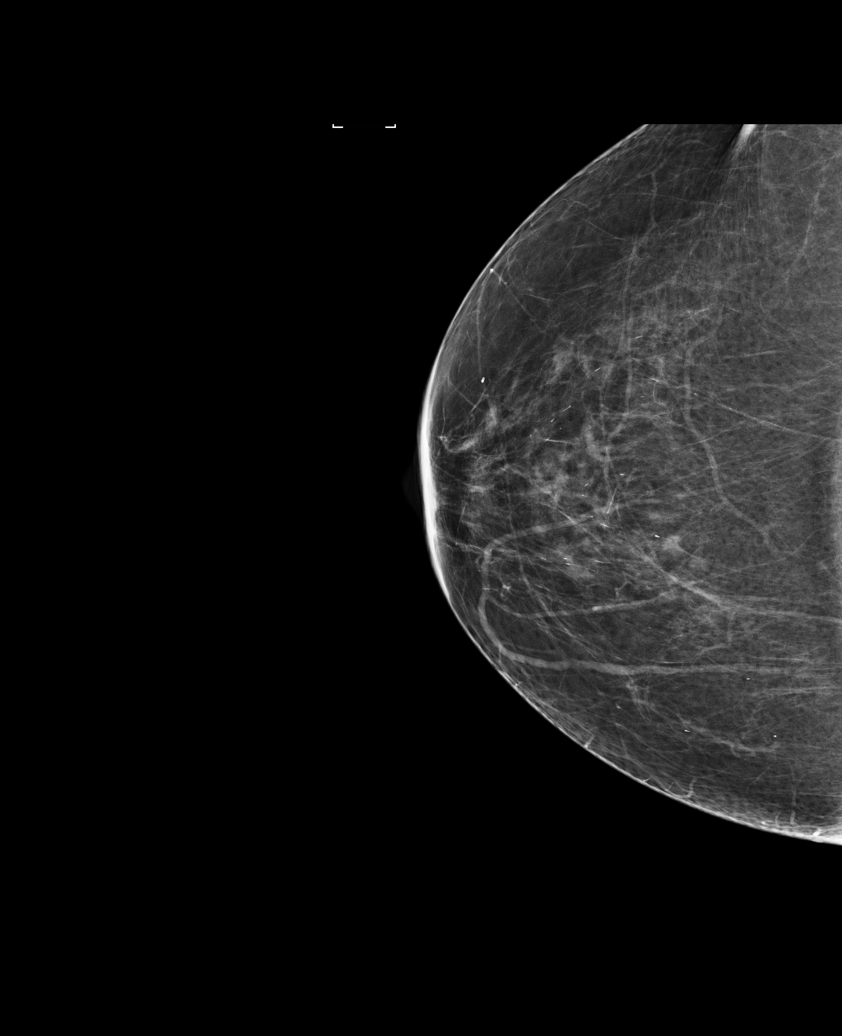

[L MLO (2 of 2)]
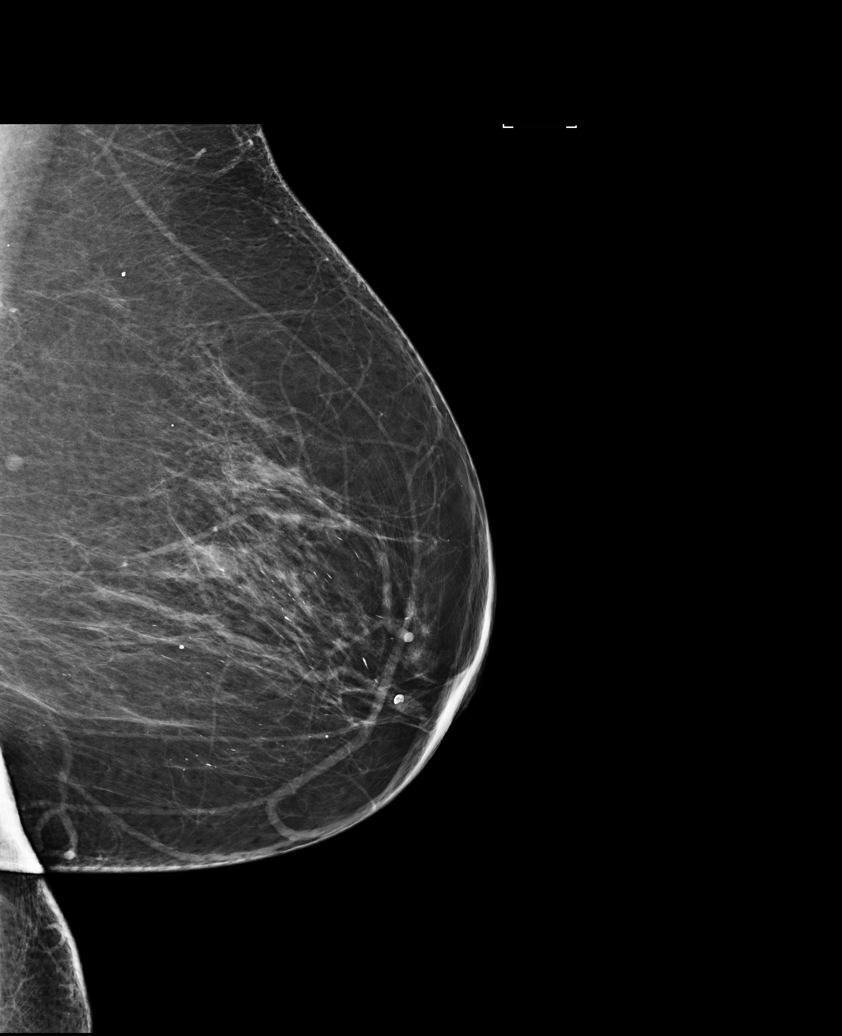

[6 of 26 positions shown; findings below may reference images not displayed]

ACR Breast Density Category b: There are scattered areas of
fibroglandular density.
FINDINGS: There are no findings suspicious for malignancy. Images were
processed with CAD.
IMPRESSION: No mammographic evidence of malignancy. A result letter of this
screening mammogram will be mailed directly to the patient.

RECOMMENDATION:
Screening mammogram in one year. (Code:97-6-RS4)

BI-RADS CATEGORY  1: Negative.
